# Patient Record
Sex: Female | Born: 1960 | Race: Black or African American | Hispanic: No | Marital: Single | Smoking: Current some day smoker
Health system: Southern US, Community
[De-identification: ages and names within clinical notes are randomized; demographics above are authoritative.]

## PROBLEM LIST (undated history)

## (undated) DIAGNOSIS — T7840XA Allergy, unspecified, initial encounter: Secondary | ICD-10-CM

## (undated) DIAGNOSIS — I10 Essential (primary) hypertension: Secondary | ICD-10-CM

## (undated) HISTORY — PX: ABDOMINAL HYSTERECTOMY: SHX81

---

## 2005-12-05 ENCOUNTER — Emergency Department (HOSPITAL_COMMUNITY): Admission: EM | Admit: 2005-12-05 | Discharge: 2005-12-05 | Payer: Self-pay | Admitting: Family Medicine

## 2006-06-01 ENCOUNTER — Emergency Department (HOSPITAL_COMMUNITY): Admission: EM | Admit: 2006-06-01 | Discharge: 2006-06-01 | Payer: Self-pay | Admitting: Family Medicine

## 2006-09-26 ENCOUNTER — Emergency Department (HOSPITAL_COMMUNITY): Admission: EM | Admit: 2006-09-26 | Discharge: 2006-09-26 | Payer: Self-pay | Admitting: Family Medicine

## 2007-09-10 ENCOUNTER — Emergency Department (HOSPITAL_COMMUNITY): Admission: EM | Admit: 2007-09-10 | Discharge: 2007-09-10 | Payer: Self-pay | Admitting: Emergency Medicine

## 2007-09-16 ENCOUNTER — Emergency Department (HOSPITAL_COMMUNITY): Admission: EM | Admit: 2007-09-16 | Discharge: 2007-09-16 | Payer: Self-pay | Admitting: Family Medicine

## 2007-12-02 ENCOUNTER — Emergency Department (HOSPITAL_COMMUNITY): Admission: EM | Admit: 2007-12-02 | Discharge: 2007-12-02 | Payer: Self-pay | Admitting: Family Medicine

## 2008-06-08 ENCOUNTER — Emergency Department (HOSPITAL_COMMUNITY): Admission: EM | Admit: 2008-06-08 | Discharge: 2008-06-08 | Payer: Self-pay | Admitting: Emergency Medicine

## 2008-06-24 ENCOUNTER — Emergency Department (HOSPITAL_COMMUNITY): Admission: EM | Admit: 2008-06-24 | Discharge: 2008-06-24 | Payer: Self-pay | Admitting: Emergency Medicine

## 2008-06-27 ENCOUNTER — Ambulatory Visit (HOSPITAL_COMMUNITY): Admission: RE | Admit: 2008-06-27 | Discharge: 2008-06-27 | Payer: Self-pay | Admitting: Orthopedic Surgery

## 2008-07-02 ENCOUNTER — Encounter: Admission: RE | Admit: 2008-07-02 | Discharge: 2008-07-13 | Payer: Self-pay | Admitting: Orthopedic Surgery

## 2009-11-04 ENCOUNTER — Emergency Department (HOSPITAL_COMMUNITY): Admission: EM | Admit: 2009-11-04 | Discharge: 2009-11-04 | Payer: Self-pay | Admitting: Family Medicine

## 2010-05-11 ENCOUNTER — Emergency Department (HOSPITAL_COMMUNITY): Admission: EM | Admit: 2010-05-11 | Discharge: 2010-05-11 | Payer: Self-pay | Admitting: Family Medicine

## 2010-11-27 LAB — POCT I-STAT, CHEM 8
Chloride: 106 mEq/L (ref 96–112)
HCT: 33 % — ABNORMAL LOW (ref 36.0–46.0)
Sodium: 139 mEq/L (ref 135–145)

## 2011-01-17 NOTE — Op Note (Signed)
NAME:  Laura Vega, SCHLEGEL            ACCOUNT NO.:  [id number]   MEDICAL RECORD NO.:  [id number]          PATIENT TYPE:  AMB   LOCATION:  SDS                          FACILITY:  [facility]   PHYSICIAN:  Artist Pais. Weingold, M.D.DATE OF BIRTH:  08-Aug-1961   DATE OF PROCEDURE:  06/27/2008  DATE OF DISCHARGE:                               OPERATIVE REPORT   PREOPERATIVE DIAGNOSIS:  Displaced left distal radius fracture, intra-  articular.   POSTOPERATIVE DIAGNOSIS:  Displaced left distal radius fracture, intra-  articular.   PROCEDURE:  Open reduction and internal fixation above.   SURGEON:  Artist Pais. Mina Marble, MD   ASSISTANT:  None.   ANESTHESIA:  General.   TOURNIQUET TIME:  46 minutes.   COMPLICATIONS:  None.   DRAINS:  None.   OPERATIVE REPORT:  The patient was taken to the operating suite and  after induction of adequate general anesthesia, the left upper extremity  was prepped and draped in usual sterile fashion.  Esmarch was used to  exsanguinate the limb and tourniquet was inflated to 250 mmHg.  At this  point in time, a incision was made over the flexor carpi radialis  tendon, 6-8 cm over the lower aspect of the left wrist.  Skin was  incised.  Sheath overlying the FCR was incised retracted to the midline.  The radial artery was tracked to the lateral side.  The interval between  these two was developed down to the level of pronator quadratus.  Pronator quadratus was subperiosteally stripped off the distal radius  revealing intra-articular volarly displaced distal radius fracture.  A  rongeur and curettes were used to debride the fracture site of clot.  Closed reduction was performed with longitudinal traction and extension  over a rolled up towel to reduce the volar displaced fracture.  At this  point in time, a standard DVR left plate was placed in the lower aspect  of the distal radius, fixed to the slotted hole on the plate, and  fluoroscopy was used to determine  adequate plate position.  Once this  was done, the remaining three cortical screws were placed proximally  followed by the smooth pegs distally.  Intraoperative fluoroscopy then  revealed near anatomic reduction both the  AP, lateral, and oblique view.  The wound was irrigated and loosely  closed in layers with 0 Vicryl to repair the pronator quadratus and a 3-  0 Prolene subcuticular stitch on the skin.  Steri-Strips, 4x4s, fluffs,  compressive dressing, and volar splint was applied.  The patient  tolerated the procedure well and went to recovery room in stable  fashion.      Artist Pais Mina Marble, M.D.  Electronically Signed     MAW/MEDQ  D:  06/27/2008  T:  06/27/2008  Job:  [id number]

## 2011-05-29 LAB — POCT URINALYSIS DIP (DEVICE)
Bilirubin Urine: NEGATIVE
Nitrite: NEGATIVE
Specific Gravity, Urine: >=1.03

## 2011-06-05 LAB — POCT URINALYSIS DIP (DEVICE)
Bilirubin Urine: NEGATIVE
Ketones, ur: NEGATIVE
Nitrite: NEGATIVE
Operator id: 235561
Urobilinogen, UA: 0.2

## 2011-06-05 LAB — CBC
HCT: 31 — ABNORMAL LOW
Hemoglobin: 10.5 — ABNORMAL LOW
MCHC: 33.9
MCV: 97.2
Platelets: 368
RBC: 3.19 — ABNORMAL LOW
RDW: 12.1
WBC: 4.7

## 2011-10-16 ENCOUNTER — Encounter (HOSPITAL_COMMUNITY): Payer: Self-pay

## 2011-10-16 ENCOUNTER — Emergency Department (INDEPENDENT_AMBULATORY_CARE_PROVIDER_SITE_OTHER)
Admission: EM | Admit: 2011-10-16 | Discharge: 2011-10-16 | Disposition: A | Discharge status: Home / Self Care | Payer: Self-pay | Source: Home / Self Care

## 2011-10-16 DIAGNOSIS — I10 Essential (primary) hypertension: Secondary | ICD-10-CM

## 2011-10-16 DIAGNOSIS — N390 Urinary tract infection, site not specified: Secondary | ICD-10-CM

## 2011-10-16 LAB — POCT URINALYSIS DIP (DEVICE)
Bilirubin Urine: NEGATIVE
Glucose, UA: NEGATIVE mg/dL
Nitrite: NEGATIVE
Urobilinogen, UA: 0.2 mg/dL (ref 0.0–1.0)
pH: 5 (ref 5.0–8.0)

## 2011-10-16 MED ORDER — CEPHALEXIN 500 MG PO CAPS: 500.0000 mg | ORAL_CAPSULE | Freq: Two times a day (BID) | ORAL | Status: AC

## 2011-10-16 NOTE — ED Provider Notes (Signed)
Laura Vega is a 51 y.o. female who presents to Urgent Care today for 1 day history of cystitis.  States that symptoms started this morning. Describes burning with urination. Also noted a little bit of blood in her urine. She denies any back pain, nausea, vomiting, abdominal pain. She has history of UTIs in the past, most recent was almost a year ago. Also endorses increased frequency. Some hesitancy.  Also of note, she her blood pressure was elevated at today's visit. She states she has a history of hypertension. She has been provided with blood pressure pills in the past. She states [facility] about her hypertension. She denies any headaches, chest pain, shortness of breath. No lower extremity edema. No history of CAD or heart problems.   PMH reviewed.  ROS as above otherwise neg Medications reviewed. No current facility-administered medications for this encounter.   No current outpatient prescriptions on file.    Exam:  BP 164/116  Pulse 82  Temp(Src) 98.8 F (37.1 C) (Oral)  Resp 18  SpO2 100% Gen: Well NAD HEENT: EOMI,  MMM Lungs: CTABL Nl WOB Heart: RRR no MRG Abd: NABS, NT, ND Exts: Non edematous BL  LE, warm and well perfused.   Assessment and Plan: #1. Cystitis: Plan to treat with Keflex 500 mg by mouth twice a day x7 days.  #2. Hypertension: No red flags on today's visit. She has a followup appointment already [facility] this [facility]. Encouraged her to keep this appointment.    Renold Don, MD 10/16/11 1900

## 2011-10-16 NOTE — ED Notes (Signed)
C/o pain w voiding since this AM; hysterectomy in past

## 2011-10-20 NOTE — ED Provider Notes (Signed)
Medical screening examination/treatment/procedure(s) were performed by non-physician practitioner and as supervising physician I was immediately available for consultation/collaboration.   Barkley Bruns MD.    Barkley Bruns, MD 10/20/11 1330

## 2012-11-27 ENCOUNTER — Encounter (HOSPITAL_COMMUNITY): Payer: Self-pay | Admitting: *Deleted

## 2012-11-27 ENCOUNTER — Emergency Department (INDEPENDENT_AMBULATORY_CARE_PROVIDER_SITE_OTHER)
Admission: EM | Admit: 2012-11-27 | Discharge: 2012-11-27 | Disposition: A | Discharge status: Home / Self Care | Payer: Self-pay | Source: Home / Self Care | Attending: Family Medicine | Admitting: Family Medicine

## 2012-11-27 DIAGNOSIS — J309 Allergic rhinitis, unspecified: Secondary | ICD-10-CM

## 2012-11-27 DIAGNOSIS — I10 Essential (primary) hypertension: Secondary | ICD-10-CM

## 2012-11-27 LAB — POCT I-STAT, CHEM 8
Chloride: 104 mEq/L (ref 96–112)
Creatinine, Ser: 0.7 mg/dL (ref 0.50–1.10)
HCT: 38 % (ref 36.0–46.0)
Hemoglobin: 12.9 g/dL (ref 12.0–15.0)
TCO2: 30 mmol/L (ref 0–100)

## 2012-11-27 MED ORDER — CETIRIZINE HCL 10 MG PO TABS: 10.0000 mg | ORAL_TABLET | Freq: Every day | ORAL | Status: DC

## 2012-11-27 MED ORDER — TRIAMTERENE-HCTZ 37.5-25 MG PO TABS: 1.0000 | ORAL_TABLET | Freq: Every day | ORAL | Status: DC

## 2012-11-27 MED ORDER — PREDNISONE 20 MG PO TABS: ORAL_TABLET | ORAL | Status: DC

## 2012-11-27 NOTE — ED Notes (Signed)
Pt  Has recurrent  Sinus  Problems   She  Reports  yest  Symptoms      Began  With  Sinus  Congestion  /  Pressure   She   Has  A  History  Of  htn  In  Past  She  States     She has  Not taken any  bp meds   In  Over  3  Months  -    She  Has  Also  neen taking    An allergy/ decongestant med  From  [facility]

## 2012-11-28 NOTE — ED Provider Notes (Signed)
History     CSN: [id number]  Arrival date & time 11/27/12  1155   First MD Initiated Contact with Patient 11/27/12 1245      Chief Complaint  Patient presents with  . Recurrent Sinusitis    (Consider location/radiation/quality/duration/timing/severity/associated sxs/prior treatment) HPI Comments: 52 year old female with history of chronic rhinitis and hypertension. Here complaining of increased clear nasal drainage, sinus pressure, sneezing and intermittent headaches. She's also found to have hypertension with blood pressure 152/103. Patient stated she is not taking any blood pressure medications for over a year. She used to go to the [facility] and has not been seen for over a year. Denies chest pain or shortness of breath. No leg swelling. No PND. Denies balance or gait or speech problems. No extremity weakness numbness or paresthesias.   History reviewed. No pertinent past medical history.  Past Surgical History  Procedure Laterality Date  . Abdominal hysterectomy      No family history on file.  History  Substance Use Topics  . Smoking status: Never Smoker   . Smokeless tobacco: Not on file  . Alcohol Use: No    OB History   Grav Para Term Preterm Abortions TAB SAB Ect Mult Living                  Review of Systems  Constitutional: Negative for fever, chills, diaphoresis and fatigue.  HENT: Positive for congestion, rhinorrhea, postnasal drip and sinus pressure. Negative for sore throat and trouble swallowing.   Eyes: Positive for itching. Negative for photophobia and visual disturbance.  Respiratory: Negative for cough and shortness of breath.   Cardiovascular: Negative for chest pain, palpitations and leg swelling.  Gastrointestinal: Negative for nausea, vomiting, abdominal pain and diarrhea.  Skin: Negative for rash.  Neurological: Positive for headaches. Negative for dizziness, tremors, seizures, syncope, speech difficulty, weakness and numbness.  All  other systems reviewed and are negative.    Allergies  Review of patient's allergies indicates no known allergies.  Home Medications   Current Outpatient Rx  Name  Route  Sig  Dispense  Refill  . cetirizine (ZYRTEC) 10 MG tablet   Oral   Take 1 tablet (10 mg total) by mouth daily.   30 tablet   0   . predniSONE (DELTASONE) 20 MG tablet      2 tabs po daily for 3 days   6 tablet   0   . triamterene-hydrochlorothiazide (MAXZIDE-25) 37.5-25 MG per tablet   Oral   Take 1 each (1 tablet total) by mouth daily.   30 tablet   1     BP 152/103  Pulse 66  Temp(Src) 97.9 F (36.6 C) (Oral)  Resp 18  SpO2 98%  Physical Exam  Nursing note and vitals reviewed. Constitutional: She is oriented to person, place, and time. She appears well-developed and well-nourished. No distress.  HENT:  Head: Normocephalic and atraumatic.  Nasal Congestion with erythema and swelling of nasal turbinates, clear rhinorrhea. pharyngeal erythema no exudates. No uvula deviation. No trismus. TM's normal  Eyes: Conjunctivae and EOM are normal. Pupils are equal, round, and reactive to light. Right eye exhibits no discharge. Left eye exhibits no discharge. No scleral icterus.  Neck: Neck supple. No JVD present.  Cardiovascular: Normal rate, regular rhythm and normal heart sounds.  Exam reveals no gallop and no friction rub.   No murmur heard. No LEE  Pulmonary/Chest: Effort normal and breath sounds normal. No respiratory distress. She has no wheezes. She  has no rales. She exhibits no tenderness.  Lymphadenopathy:    She has no cervical adenopathy.  Neurological: She is alert and oriented to person, place, and time.  Skin: No rash noted. She is not diaphoretic.    ED Course  Procedures (including critical care time)  Labs Reviewed  POCT I-STAT, CHEM 8 - Abnormal; Notable for the following:    Glucose, Bld 125 (*)    Calcium, Ion 1.26 (*)    All other components within normal limits   No  results found.   1. Allergic rhinitis   2. HTN (hypertension)       MDM  Treated with cetirizine, prednisone. Normal creatinine on point-of-care i-STAT. Prescribed Maxzide for blood pressure treatment. Asked patient to take half a tablet daily during the first 2 weeks and have her blood pressure rechecked at the [facility] or here. Supportive care and red flags that should prompt her return to medical attention discussed with patient and provided in writing.        Sharin Grave, MD 11/29/12 1243

## 2012-12-02 ENCOUNTER — Emergency Department (INDEPENDENT_AMBULATORY_CARE_PROVIDER_SITE_OTHER)
Admission: EM | Admit: 2012-12-02 | Discharge: 2012-12-02 | Disposition: A | Discharge status: Home / Self Care | Payer: Self-pay | Source: Home / Self Care

## 2012-12-02 ENCOUNTER — Encounter (HOSPITAL_COMMUNITY): Payer: Self-pay | Admitting: Emergency Medicine

## 2012-12-02 DIAGNOSIS — T50905A Adverse effect of unspecified drugs, medicaments and biological substances, initial encounter: Secondary | ICD-10-CM

## 2012-12-02 DIAGNOSIS — R42 Dizziness and giddiness: Secondary | ICD-10-CM

## 2012-12-02 DIAGNOSIS — I10 Essential (primary) hypertension: Secondary | ICD-10-CM

## 2012-12-02 DIAGNOSIS — T887XXA Unspecified adverse effect of drug or medicament, initial encounter: Secondary | ICD-10-CM

## 2012-12-02 HISTORY — DX: Essential (primary) hypertension: I10

## 2012-12-02 NOTE — ED Notes (Signed)
Reports dizziness started this am after taking medicine.  Denies dizziness at present.

## 2012-12-02 NOTE — ED Provider Notes (Signed)
History     CSN: [id number]  Arrival date & time 12/02/12  1314   None     No chief complaint on file.   (Consider location/radiation/quality/duration/timing/severity/associated sxs/prior treatment) HPI Comments: 52 year old female was here on 11/27/2012 for her chronic rhinitis and hypertension. Her blood pressure was 152/103. She did not been taking her medications for it she seen her PCP in over a year. At discharge is given a prescription for Dyazide to take one a day. Since taking the medicines she is twice had episodes of dizziness and daily nausea. The dizziness lasts for approximately 30 minutes on average. She states she does not eat breakfast or in the morning but she takes her medicine in the morning. This morning the episode of dizziness was a little more pronounced and had to go home from work. She did not have syncope, chest pain, shortness of breath or headache.   No past medical history on file.  Past Surgical History  Procedure Laterality Date  . Abdominal hysterectomy      No family history on file.  History  Substance Use Topics  . Smoking status: Never Smoker   . Smokeless tobacco: Not on file  . Alcohol Use: No    OB History   Grav Para Term Preterm Abortions TAB SAB Ect Mult Living                  Review of Systems  Constitutional: Negative for fever, activity change, appetite change and fatigue.  HENT: Negative.   Eyes: Negative.   Respiratory: Negative.   Gastrointestinal: Positive for nausea. Negative for abdominal pain.  Musculoskeletal: Negative.   Skin: Negative.   Neurological: Positive for dizziness and light-headedness. Negative for tremors, seizures, syncope, facial asymmetry, speech difficulty, weakness and numbness.  Psychiatric/Behavioral: Negative.     Allergies  Review of patient's allergies indicates no known allergies.  Home Medications   Current Outpatient Rx  Name  Route  Sig  Dispense  Refill  . cetirizine (ZYRTEC) 10  MG tablet   Oral   Take 1 tablet (10 mg total) by mouth daily.   30 tablet   0   . predniSONE (DELTASONE) 20 MG tablet      2 tabs po daily for 3 days   6 tablet   0   . triamterene-hydrochlorothiazide (MAXZIDE-25) 37.5-25 MG per tablet   Oral   Take 1 each (1 tablet total) by mouth daily.   30 tablet   1     BP 116/81  Pulse 105  Temp(Src) 97.8 F (36.6 C) (Oral)  Resp 16  SpO2 98%  Physical Exam  Nursing note and vitals reviewed. Constitutional: She is oriented to person, place, and time. She appears well-developed and well-nourished. No distress.  HENT:  Head: Normocephalic and atraumatic.  Right Ear: External ear normal.  Left Ear: External ear normal.  Eyes: EOM are normal. Pupils are equal, round, and reactive to light.  Neck: Normal range of motion. Neck supple.  Cardiovascular: Normal rate and normal heart sounds.   Pulmonary/Chest: Effort normal and breath sounds normal. No respiratory distress.  Abdominal: Soft. There is no tenderness.  Musculoskeletal: Normal range of motion. She exhibits no edema and no tenderness.  Neurological: She is alert and oriented to person, place, and time. No cranial nerve deficit.  Skin: Skin is warm and dry.  Psychiatric: She has a normal mood and affect.    ED Course  Procedures (including critical care time)  Labs Reviewed -  No data to display No results found.   1. Dizziness, nonspecific   2. HTN (hypertension)   3. Medication reaction, initial encounter       MDM  I suspect the dose of Dyazide in combination with taking the medicine in the morning on an stomach is causing her side effects of nausea and dizziness. She is asked to eat something prior to taking the medication and since she is at lunch take medicine at lunch. Her medicine is a tablet she is also advised to take only one half a tablet a day for now. Check your blood pressure at home every other day and record it in the diary. When you see your  physician early this coming month showed the blood pressure seen him as well as her current medication. He continued to have symptoms may return.         Hayden Rasmussen, NP 12/02/12 1601

## 2012-12-05 NOTE — ED Provider Notes (Signed)
Medical screening examination/treatment/procedure(s) were performed by resident physician or non-physician practitioner and as supervising physician I was immediately available for consultation/collaboration.   Alanson Hausmann DOUGLAS MD.   Gera Inboden D Breanna Mcdaniel, MD 12/05/12 1939 

## 2012-12-25 ENCOUNTER — Encounter (HOSPITAL_COMMUNITY): Payer: Self-pay

## 2012-12-25 ENCOUNTER — Emergency Department (INDEPENDENT_AMBULATORY_CARE_PROVIDER_SITE_OTHER)
Admission: EM | Admit: 2012-12-25 | Discharge: 2012-12-25 | Disposition: A | Discharge status: Home / Self Care | Payer: Self-pay | Source: Home / Self Care | Attending: Family Medicine | Admitting: Family Medicine

## 2012-12-25 DIAGNOSIS — J309 Allergic rhinitis, unspecified: Secondary | ICD-10-CM

## 2012-12-25 DIAGNOSIS — J302 Other seasonal allergic rhinitis: Secondary | ICD-10-CM

## 2012-12-25 MED ORDER — FLUTICASONE PROPIONATE 50 MCG/ACT NA SUSP: 2.0000 | Freq: Every day | NASAL | Status: DC

## 2012-12-25 NOTE — ED Notes (Signed)
C/o her allergies are bothering her, uses zyrtec from time to time

## 2012-12-25 NOTE — ED Provider Notes (Signed)
History     CSN: [id number]  Arrival date & time 12/25/12  1234   First MD Initiated Contact with Patient 12/25/12 [phone]      Chief Complaint  Patient presents with  . Allergies    (Consider location/radiation/quality/duration/timing/severity/associated sxs/prior treatment) HPI Comments: Pt reports problems with seasonal allergies. Has not been taking zyrtec regularly.   Patient is a 52 y.o. female presenting with URI. The history is provided by the patient.  URI Presenting symptoms: congestion, cough and rhinorrhea   Presenting symptoms: no fever   Severity:  Moderate Onset quality:  Gradual Duration:  3 weeks Timing:  Constant Progression:  Unchanged Chronicity:  New Relieved by:  Nothing Worsened by:  Nothing tried Ineffective treatments: occasional use of zyrtec. Associated symptoms: sneezing   Associated symptoms: no sinus pain   Associated symptoms comment:  Itchy watery eyes   Past Medical History  Diagnosis Date  . Hypertension     Past Surgical History  Procedure Laterality Date  . Abdominal hysterectomy      History reviewed. No pertinent family history.  History  Substance Use Topics  . Smoking status: Never Smoker   . Smokeless tobacco: Not on file  . Alcohol Use: No    OB History   Grav Para Term Preterm Abortions TAB SAB Ect Mult Living                  Review of Systems  Constitutional: Negative for fever and chills.  HENT: Positive for congestion, rhinorrhea, sneezing and postnasal drip.   Eyes: Positive for discharge and itching. Negative for photophobia, redness and visual disturbance.  Respiratory: Positive for cough.     Allergies  Review of patient's allergies indicates no known allergies.  Home Medications   Current Outpatient Rx  Name  Route  Sig  Dispense  Refill  . cetirizine (ZYRTEC) 10 MG tablet   Oral   Take 1 tablet (10 mg total) by mouth daily.   30 tablet   0   . fluticasone (FLONASE) 50 MCG/ACT nasal  spray   Nasal   Place 2 sprays into the nose daily.   16 g   2   . predniSONE (DELTASONE) 20 MG tablet      2 tabs po daily for 3 days   6 tablet   0   . triamterene-hydrochlorothiazide (MAXZIDE-25) 37.5-25 MG per tablet   Oral   Take 1 each (1 tablet total) by mouth daily.   30 tablet   1     BP 123/85  Pulse 78  Temp(Src) 97.9 F (36.6 C) (Oral)  Resp 16  SpO2 100%  Physical Exam  Constitutional: She appears well-developed and well-nourished. No distress.  HENT:  Right Ear: Tympanic membrane, external ear and ear canal normal.  Left Ear: Tympanic membrane, external ear and ear canal normal.  Nose: Rhinorrhea present. Right sinus exhibits no maxillary sinus tenderness and no frontal sinus tenderness. Left sinus exhibits no maxillary sinus tenderness and no frontal sinus tenderness.  Mouth/Throat: Oropharynx is clear and moist and mucous membranes are normal.  Eyes: Conjunctivae are normal. Right eye exhibits no discharge. Left eye exhibits no discharge.  Cardiovascular: Normal rate and regular rhythm.   Pulmonary/Chest: Effort normal and breath sounds normal.    ED Course  Procedures (including critical care time)  Labs Reviewed - No data to display No results found.   1. Seasonal allergies       MDM  Zyrtec daily, rx flonase.  Cathlyn Parsons, NP 12/25/12 1530

## 2012-12-25 NOTE — ED Notes (Signed)
Vitals taken by [employer]

## 2012-12-25 NOTE — Discharge Instructions (Signed)
Use the zyrtec and the nasal spray EVERY day while the pollen/spring season is bothering you.    Allergic Rhinitis Allergic rhinitis is when the mucous membranes in the nose respond to allergens. Allergens are particles in the air that cause your body to have an allergic reaction. This causes you to release allergic antibodies. Through a chain of events, these eventually cause you to release histamine into the blood stream (hence the use of antihistamines). Although meant to be protective to the body, it is this release that causes your discomfort, such as frequent sneezing, congestion and an itchy runny nose.  CAUSES  The pollen allergens may come from grasses, trees, and weeds. This is seasonal allergic rhinitis, or "hay fever." Other allergens cause year-round allergic rhinitis (perennial allergic rhinitis) such as house dust mite allergen, pet dander and mold spores.  SYMPTOMS   Nasal stuffiness (congestion).  Runny, itchy nose with sneezing and tearing of the eyes.  There is often an itching of the mouth, eyes and ears. It cannot be cured, but it can be controlled with medications. DIAGNOSIS  If you are unable to determine the offending allergen, skin or blood testing may find it. TREATMENT   Avoid the allergen.  Medications and allergy shots (immunotherapy) can help.  Hay fever may often be treated with antihistamines in pill or nasal spray forms. Antihistamines block the effects of histamine. There are over-the-counter medicines that may help with nasal congestion and swelling around the eyes. Check with your caregiver before taking or giving this medicine. If the treatment above does not work, there are many new medications your caregiver can prescribe. Stronger medications may be used if initial measures are ineffective. Desensitizing injections can be used if medications and avoidance fails. Desensitization is when a patient is given ongoing shots until the body becomes less  sensitive to the allergen. Make sure you follow up with your caregiver if problems continue. SEEK MEDICAL CARE IF:   You develop fever (more than 100.5 F (38.1 C).  You develop a cough that does not stop easily (persistent).  You have shortness of breath.  You start wheezing.  Symptoms interfere with normal daily activities. Document Released: 05/16/2001 Document Revised: 11/13/2011 Document Reviewed: 11/25/2008 [facility] Patient Information 2013 [place], [place].

## 2012-12-26 NOTE — ED Provider Notes (Signed)
Medical screening examination/treatment/procedure(s) were performed by non-physician practitioner and as supervising physician I was immediately available for consultation/collaboration.   [facility]; MD  Sharin Grave, MD 12/26/12 [phone]

## 2014-01-17 ENCOUNTER — Emergency Department (HOSPITAL_COMMUNITY)
Admission: EM | Admit: 2014-01-17 | Discharge: 2014-01-17 | Disposition: A | Discharge status: Home / Self Care | Payer: No Typology Code available for payment source | Attending: Emergency Medicine | Admitting: Emergency Medicine

## 2014-01-17 ENCOUNTER — Emergency Department (HOSPITAL_COMMUNITY): Payer: No Typology Code available for payment source

## 2014-01-17 ENCOUNTER — Encounter (HOSPITAL_COMMUNITY): Payer: Self-pay | Admitting: Emergency Medicine

## 2014-01-17 DIAGNOSIS — Z789 Other specified health status: Secondary | ICD-10-CM

## 2014-01-17 DIAGNOSIS — R34 Anuria and oliguria: Secondary | ICD-10-CM | POA: Insufficient documentation

## 2014-01-17 DIAGNOSIS — F1010 Alcohol abuse, uncomplicated: Secondary | ICD-10-CM | POA: Insufficient documentation

## 2014-01-17 DIAGNOSIS — Z79899 Other long term (current) drug therapy: Secondary | ICD-10-CM | POA: Insufficient documentation

## 2014-01-17 DIAGNOSIS — IMO0002 Reserved for concepts with insufficient information to code with codable children: Secondary | ICD-10-CM | POA: Insufficient documentation

## 2014-01-17 DIAGNOSIS — I10 Essential (primary) hypertension: Secondary | ICD-10-CM | POA: Insufficient documentation

## 2014-01-17 DIAGNOSIS — Z792 Long term (current) use of antibiotics: Secondary | ICD-10-CM | POA: Insufficient documentation

## 2014-01-17 DIAGNOSIS — K859 Acute pancreatitis without necrosis or infection, unspecified: Secondary | ICD-10-CM | POA: Insufficient documentation

## 2014-01-17 DIAGNOSIS — K7460 Unspecified cirrhosis of liver: Secondary | ICD-10-CM | POA: Insufficient documentation

## 2014-01-17 LAB — URINALYSIS, ROUTINE W REFLEX MICROSCOPIC
Bilirubin Urine: NEGATIVE
Glucose, UA: NEGATIVE mg/dL
KETONES UR: NEGATIVE mg/dL
NITRITE: NEGATIVE
PH: 6.5 (ref 5.0–8.0)
Protein, ur: 100 mg/dL — AB
Specific Gravity, Urine: 1.017 (ref 1.005–1.030)
UROBILINOGEN UA: 0.2 mg/dL (ref 0.0–1.0)

## 2014-01-17 LAB — HEPATIC FUNCTION PANEL
ALK PHOS: 57 U/L (ref 39–117)
ALT: 41 U/L — ABNORMAL HIGH (ref 0–35)
AST: 39 U/L — ABNORMAL HIGH (ref 0–37)
Albumin: 3.6 g/dL (ref 3.5–5.2)
Bilirubin, Direct: <0.2 mg/dL (ref 0.0–0.3)
TOTAL PROTEIN: 8.2 g/dL (ref 6.0–8.3)
Total Bilirubin: 0.5 mg/dL (ref 0.3–1.2)

## 2014-01-17 LAB — BASIC METABOLIC PANEL
BUN: 8 mg/dL (ref 6–23)
CALCIUM: 10 mg/dL (ref 8.4–10.5)
CHLORIDE: 93 meq/L — AB (ref 96–112)
CO2: 25 meq/L (ref 19–32)
Creatinine, Ser: 0.55 mg/dL (ref 0.50–1.10)
Glucose, Bld: 155 mg/dL — ABNORMAL HIGH (ref 70–99)
POTASSIUM: 3.7 meq/L (ref 3.7–5.3)
SODIUM: 137 meq/L (ref 137–147)

## 2014-01-17 LAB — CBC WITH DIFFERENTIAL/PLATELET
BASOS PCT: 0 % (ref 0–1)
Basophils Absolute: 0 10*3/uL (ref 0.0–0.1)
EOS PCT: 0 % (ref 0–5)
Eosinophils Absolute: 0 10*3/uL (ref 0.0–0.7)
HCT: 32.9 % — ABNORMAL LOW (ref 36.0–46.0)
HEMOGLOBIN: 11.3 g/dL — AB (ref 12.0–15.0)
LYMPHS ABS: 1.1 10*3/uL (ref 0.7–4.0)
Lymphocytes Relative: 9 % — ABNORMAL LOW (ref 12–46)
MCH: 31.7 pg (ref 26.0–34.0)
MCHC: 34.3 g/dL (ref 30.0–36.0)
MCV: 92.4 fL (ref 78.0–100.0)
MONOS PCT: 6 % (ref 3–12)
Monocytes Absolute: 0.7 10*3/uL (ref 0.1–1.0)
NEUTROS PCT: 85 % — AB (ref 43–77)
Neutro Abs: 9.8 10*3/uL — ABNORMAL HIGH (ref 1.7–7.7)
PLATELETS: 260 10*3/uL (ref 150–400)
RBC: 3.56 MIL/uL — ABNORMAL LOW (ref 3.87–5.11)
RDW: 12.6 % (ref 11.5–15.5)
WBC: 11.6 10*3/uL — AB (ref 4.0–10.5)

## 2014-01-17 LAB — URINE MICROSCOPIC-ADD ON

## 2014-01-17 LAB — LIPASE, BLOOD: Lipase: 175 U/L — ABNORMAL HIGH (ref 11–59)

## 2014-01-17 MED ORDER — OXYCODONE-ACETAMINOPHEN 5-325 MG PO TABS: 1.0000 | ORAL_TABLET | Freq: Four times a day (QID) | ORAL | Status: DC | PRN

## 2014-01-17 MED ORDER — PROMETHAZINE HCL 25 MG PO TABS: 25.0000 mg | ORAL_TABLET | Freq: Four times a day (QID) | ORAL | Status: DC | PRN

## 2014-01-17 MED ORDER — SODIUM CHLORIDE 0.9 % IV BOLUS (SEPSIS)
1000.0000 mL | Freq: Once | INTRAVENOUS | Status: AC
  Administered 2014-01-17: 1000 mL via INTRAVENOUS

## 2014-01-17 MED ORDER — MORPHINE SULFATE 4 MG/ML IJ SOLN
4.0000 mg | Freq: Once | INTRAMUSCULAR | Status: AC
  Administered 2014-01-17: 4 mg via INTRAVENOUS
  Filled 2014-01-17: qty 1

## 2014-01-17 MED ORDER — SODIUM CHLORIDE 0.9 % IV SOLN
INTRAVENOUS | Status: DC
  Administered 2014-01-17: 08:00:00 via INTRAVENOUS

## 2014-01-17 MED ORDER — KETOROLAC TROMETHAMINE 30 MG/ML IJ SOLN
30.0000 mg | Freq: Once | INTRAMUSCULAR | Status: AC
  Administered 2014-01-17: 30 mg via INTRAVENOUS
  Filled 2014-01-17: qty 1

## 2014-01-17 MED ORDER — ONDANSETRON HCL 4 MG/2ML IJ SOLN
4.0000 mg | Freq: Once | INTRAMUSCULAR | Status: AC
  Administered 2014-01-17: 4 mg via INTRAVENOUS
  Filled 2014-01-17: qty 2

## 2014-01-17 NOTE — ED Provider Notes (Signed)
CSN: [id number]     Arrival date & time 01/17/14  0745 History   First MD Initiated Contact with Patient 01/17/14 [phone]     Chief Complaint  Patient presents with  . Flank Pain  . Abdominal Pain     (Consider location/radiation/quality/duration/timing/severity/associated sxs/prior Treatment) HPI  Patient presents to the emergency department with no significant past medical history with complaints of left-sided flank pain that started yesterday. She has had decreasing urination (nurse reports no change to urination, I asked pt twice and she says less urination) but normal bowel movements. She states that the pain is in her left side back and radiates down towards her groin. She feels like there is pressure inside. The pain shoots down and she is writhing in there exam bed. She reports the pain is severe and she has had 1 episode of vomiting which happened last night. She has not had any diarrhea or fever, no vaginal discharge. No hx of abdominal surgeries, kidney stones or diverticulitis.   Past Medical History  Diagnosis Date  . Hypertension    Past Surgical History  Procedure Laterality Date  . Abdominal hysterectomy     No family history on file. History  Substance Use Topics  . Smoking status: Never Smoker   . Smokeless tobacco: Not on file  . Alcohol Use: Yes     Comment: occasionally    OB History   Grav Para Term Preterm Abortions TAB SAB Ect Mult Living                 Review of Systems   Review of Systems  Gen: no weight loss, fevers, chills, night sweats  Eyes: no discharge or drainage, no occular pain or visual changes  Nose: no epistaxis or rhinorrhea  Mouth: no dental pain, no sore throat  Neck: no neck pain  Lungs:No wheezing, coughing or hemoptysis CV: no chest pain, palpitations, dependent edema or orthopnea  Abd: + flank pain nausea, vomiting,  NO diarrhea GU: no dysuria or gross hematuria  MSK:  No muscle weakness or pain Neuro: no headache, no focal  neurologic deficits  Skin: no rash or wounds Psyche: no complaints    Allergies  Review of patient's allergies indicates no known allergies.  Home Medications   Prior to Admission medications   Medication Sig Start Date End Date Taking? Authorizing Provider  cetirizine (ZYRTEC) 10 MG tablet Take 1 tablet (10 mg total) by mouth daily. 11/27/12   Adlih Moreno-Coll, MD  fluticasone (FLONASE) 50 MCG/ACT nasal spray Place 2 sprays into the nose daily. 12/25/12   Cathlyn Parsons, NP  predniSONE (DELTASONE) 20 MG tablet 2 tabs po daily for 3 days 11/27/12   Sharin Grave, MD  triamterene-hydrochlorothiazide (MAXZIDE-25) 37.5-25 MG per tablet Take 1 each (1 tablet total) by mouth daily. 11/27/12   Adlih Moreno-Coll, MD   BP 95/53  Pulse 70  Temp(Src) 98.5 F (36.9 C) (Oral)  Resp 20  Ht 5\' 6"  (1.676 m)  Wt 147 lb (66.679 kg)  BMI 23.74 kg/m2  SpO2 98% Physical Exam  Nursing note and vitals reviewed. Constitutional: She appears well-developed and well-nourished. No distress.  HENT:  Head: Normocephalic and atraumatic.  Eyes: Pupils are equal, round, and reactive to light.  Neck: Normal range of motion. Neck supple.  Cardiovascular: Normal rate and regular rhythm.   Pulmonary/Chest: Effort normal.  Abdominal: Soft. Bowel sounds are normal. She exhibits no distension. There is tenderness in the left upper quadrant and left lower quadrant.  There is CVA tenderness. There is no rigidity and no guarding. No hernia.  Neurological: She is alert.  Skin: Skin is warm and dry.    ED Course  Procedures (including critical care time) Labs Review Labs Reviewed  URINALYSIS, ROUTINE W REFLEX MICROSCOPIC - Abnormal; Notable for the following:    Hgb urine dipstick MODERATE (*)    Protein, ur 100 (*)    Leukocytes, UA TRACE (*)    All other components within normal limits  CBC WITH DIFFERENTIAL - Abnormal; Notable for the following:    WBC 11.6 (*)    RBC 3.56 (*)    Hemoglobin 11.3 (*)     HCT 32.9 (*)    Neutrophils Relative % 85 (*)    Neutro Abs 9.8 (*)    Lymphocytes Relative 9 (*)    All other components within normal limits  BASIC METABOLIC PANEL - Abnormal; Notable for the following:    Chloride 93 (*)    Glucose, Bld 155 (*)    All other components within normal limits  URINE MICROSCOPIC-ADD ON - Abnormal; Notable for the following:    Squamous Epithelial / LPF FEW (*)    All other components within normal limits  HEPATIC FUNCTION PANEL - Abnormal; Notable for the following:    AST 39 (*)    ALT 41 (*)    All other components within normal limits  LIPASE, BLOOD - Abnormal; Notable for the following:    Lipase 175 (*)    All other components within normal limits    Imaging Review Ct Abdomen Pelvis Wo Contrast  01/17/2014   CLINICAL DATA:  Left flank pain  EXAM: CT ABDOMEN AND PELVIS WITHOUT CONTRAST  TECHNIQUE: Multidetector CT imaging of the abdomen and pelvis was performed following the standard protocol without IV contrast.  COMPARISON:  None.  FINDINGS: There are extensive inflammatory changes in an about the pancreas. Fluid density and stranding extends from the tail inferiorly into the retroperitoneum and into the left perinephric space. No obvious pancreatic hemorrhage. Contrast was not administered.  Diffuse hepatic steatosis. There is an area of relatively higher density in the left lobe worrisome for a small mass lesion measuring 3.1 x 3.6 cm. The liver is markedly enlarged. The left lobe is prominent without nodularity. Early cirrhotic change cannot be excluded.  Gallbladder is unremarkable.  Right kidney and adrenal glands are within normal limits. Spleen is within normal limits.  Normal appendix.  Bladder is unremarkable.  3.5 cm simple cyst in the left adnexa.  IMPRESSION: Findings compatible with acute pancreatitis. Inflammatory changes to extend to the left perinephric space and left side of the retroperitoneum.  Abnormal appearance of the liver as  described. There is enlargement and diffuse hepatic steatosis. Early features of cirrhosis may be present and there is a 3.5 cm mass in the left lobe. They should be worked up with an MRI of the liver with an without contrast.   Electronically Signed   By: Maryclare BeanArt  Hoss M.D.   On: 01/17/2014 10:22     EKG Interpretation None      MDM   Final diagnoses:  Acute pancreatitis  Cirrhosis    Patient most likely with Kidney stone. Other possible differentials includes musculoskeletal injury, pyelonephritis, diverticulitis.  Cbc, bmp, urinalysis ordered. IV saline, morphine and Zofran given for pain control.  Scan is suggestive of acute pancreatitis and early cirrhosis. The patient now admits that she has been drinking daily since march to celebrate graduation and b-day  parties. She is no longer having pain and looks well. [facility], they will be able to follow-up on her visit today.  She feels well enough and prefers to go home, she has decided that she will stop drinking. Discussed clear liquid diet for a few days.   Rx: Percocet and phenergan  52 y.o.Laura Vega's evaluation in the Emergency Department is complete. It has been determined that no acute conditions requiring further emergency intervention are present at this time. The patient/guardian have been advised of the diagnosis and plan. We have discussed signs and symptoms that warrant return to the ED, such as changes or worsening in symptoms.  Vital signs are stable at discharge. Filed Vitals:   01/17/14 1045  BP: 95/53  Pulse: 70  Temp:   Resp:     Patient/guardian has voiced understanding and agreed to follow-up with the PCP or specialist.    Dorthula Matasiffany G Phinley Schall, PA-C 01/17/14 1127

## 2014-01-17 NOTE — ED Notes (Signed)
Pt c/o left sided flank pain that started yesterday. Stated that the left side of her back in swollen now. Self administered OTC laxative medication that did not help with pain either. Denies any trouble urinating. Stated last BM was Tuesday. Stated that she vomited last night also.

## 2014-01-17 NOTE — Discharge Instructions (Signed)
Diet The clear liquid diet consists of foods that are liquid or will become liquid at room temperature. Examples of foods allowed on a clear liquid diet include fruit juice, broth or bouillon, gelatin, or frozen ice pops. You should be able to see through the liquid. The purpose of this diet is to provide the necessary fluids, electrolytes (such as sodium and potassium), and energy to keep the body functioning during times when you are not able to consume a regular diet. A clear liquid diet should not be continued for long periods of time, as it is not nutritionally adequate.  A CLEAR LIQUID DIET MAY BE NEEDED:  When a sudden-onset (acute) condition occurs before or after surgery.   As the first step in oral feeding.   For fluid and electrolyte replacement in diarrheal diseases.   As a diet before certain medical tests are performed.  ADEQUACY The clear liquid diet is adequate only in ascorbic acid, according to the Recommended Dietary Allowances of the [employer].  CHOOSING FOODS Breads and Starches  Allowed: None are allowed.   Avoid: All are to be avoided.  Vegetables  Allowed: Strained vegetable juices.   Avoid: Any others.  Fruit  Allowed: Strained fruit juices and fruit drinks. Include 1 serving of citrus or vitamin C-enriched fruit juice daily.   Avoid: Any others.  Meat and Meat Substitutes  Allowed: None are allowed.   Avoid: All are to be avoided.  Milk Products  Allowed: None are allowed.   Avoid: All are to be avoided.  Soups and Combination Foods  Allowed: Clear bouillon, broth, or strained broth-based soups.   Avoid: Any others.  Desserts and Sweets  Allowed: Sugar, honey. High-protein gelatin. Flavored gelatin, ices, or frozen ice pops that do not contain milk.   Avoid: Any others.  Fats and Oils  Allowed: None are allowed.   Avoid: All are to be avoided.  Beverages  Allowed: Cereal  beverages, coffee (regular or decaffeinated), tea, or soda at the discretion of your health care provider.   Avoid: Any others.  Condiments  Allowed: Salt.   Avoid: Any others, including pepper.  Supplements  Allowed: Liquid nutrition beverages that you can see through.   Avoid: Any others that contain lactose or fiber. SAMPLE MEAL PLAN Breakfast  4 oz (120 mL) strained orange juice.   to 1 cup (120 to 240 mL) gelatin (plain or fortified).  1 cup (240 mL) beverage (coffee or tea).  Sugar, if desired. Midmorning Snack   cup (120 mL) gelatin (plain or fortified). Lunch  1 cup (240 mL) broth or consomm.  4 oz (120 mL) strained grapefruit juice.   cup (120 mL) gelatin (plain or fortified).  1 cup (240 mL) beverage (coffee or tea).  Sugar, if desired. Midafternoon Snack   cup (120 mL) fruit ice.   cup (120 mL) strained fruit juice. Dinner  1 cup (240 mL) broth or consomm.   cup (120 mL) cranberry juice.   cup (120 mL) flavored gelatin (plain or fortified).  1 cup (240 mL) beverage (coffee or tea).  Sugar, if desired. Evening Snack  4 oz (120 mL) strained apple juice (vitamin C-fortified).   cup (120 mL) flavored gelatin (plain or fortified). MAKE SURE YOU:  Understand these instructions.  Will watch your child's condition.  Will get help right away if your child is not doing well or gets worse. Document Released: 08/21/2005 Document Revised: 04/23/2013 Document Reviewed: 01/21/2013 [facility] Patient Information 2014 [place], [place].  Acute Pancreatitis Acute pancreatitis is a disease in which the pancreas becomes suddenly inflamed. The pancreas is a large gland located behind your stomach. The pancreas produces enzymes that help digest food. The pancreas also releases the hormones glucagon and insulin that help regulate blood sugar. Damage to the pancreas occurs when the digestive enzymes from the pancreas are activated and begin  attacking the pancreas before being released into the intestine. Most acute attacks last a couple of days and can cause serious complications. Some people become dehydrated and develop low blood pressure. In severe cases, bleeding into the pancreas can lead to shock and can be life-threatening. The lungs, heart, and kidneys may fail. CAUSES  Pancreatitis can happen to anyone. In some cases, the cause is unknown. Most cases are caused by:  Alcohol abuse.  Gallstones. Other less common causes are:  Certain medicines.  Exposure to certain chemicals.  Infection.  Damage caused by an accident (trauma).  Abdominal surgery. SYMPTOMS   Pain in the upper abdomen that may radiate to the back.  Tenderness and swelling of the abdomen.  Nausea and vomiting. DIAGNOSIS  Your caregiver will perform a physical exam. Blood and stool tests may be done to confirm the diagnosis. Imaging tests may also be done, such as X-rays, CT scans, or an ultrasound of the abdomen. TREATMENT  Treatment usually requires a stay in the hospital. Treatment may include:  Pain medicine.  Fluid replacement through an intravenous line (IV).  Placing a tube in the stomach to remove stomach contents and control vomiting.  Not eating for 3 or 4 days. This gives your pancreas a rest, because enzymes are not being produced that can cause further damage.  Antibiotic medicines if your condition is caused by an infection.  Surgery of the pancreas or gallbladder. HOME CARE INSTRUCTIONS   Follow the diet advised by your caregiver. This may involve avoiding alcohol and decreasing the amount of fat in your diet.  Eat smaller, more frequent meals. This reduces the amount of digestive juices the pancreas produces.  Drink enough fluids to keep your urine clear or pale yellow.  Only take over-the-counter or prescription medicines as directed by your caregiver.  Avoid drinking alcohol if it caused your condition.  Do not  smoke.  Get plenty of rest.  Check your blood sugar at home as directed by your caregiver.  Keep all follow-up appointments as directed by your caregiver. SEEK MEDICAL CARE IF:   You do not recover as quickly as expected.  You develop new or worsening symptoms.  You have persistent pain, weakness, or nausea.  You recover and then have another episode of pain. SEEK IMMEDIATE MEDICAL CARE IF:   You are unable to eat or keep fluids down.  Your pain becomes severe.  You have a fever or persistent symptoms for more than 2 to 3 days.  You have a fever and your symptoms suddenly get worse.  Your skin or the white part of your eyes turn yellow (jaundice).  You develop vomiting.  You feel dizzy, or you faint.  Your blood sugar is high (over 300 mg/dL). MAKE SURE YOU:   Understand these instructions.  Will watch your condition.  Will get help right away if you are not doing well or get worse. Document Released: 08/21/2005 Document Revised: 02/20/2012 Document Reviewed: 11/30/2011 [facility] Patient Information 2014 [place], [place].  Cirrhosis Cirrhosis is a condition of scarring of the liver which is caused when the liver has tried repairing itself following damage.  This damage may come from a previous infection such as one of the forms of hepatitis (usually hepatitis C), or the damage may come from being injured by toxins. The main toxin that causes this damage is alcohol. The scarring of the liver from use of alcohol is irreversible. That means the liver cannot return to normal even though alcohol is not used any more. The main danger of hepatitis C infection is that it may cause long-lasting (chronic) liver disease, and this also may lead to cirrhosis. This complication is progressive and irreversible. CAUSES  Prior to available blood tests, hepatitis C could be contracted by blood transfusions. Since testing of blood has improved, this is now unlikely. This infection can also be  contracted through intravenous drug use and the sharing of needles. It can also be contracted through sexual relationships. The injury caused by alcohol comes from too much use. It is not a few drinks that poison the liver, but years of misuse. Usually there will be some signs and symptoms early with scarring of the liver that suggest the development of better habits. Alcohol should never be used while using acetaminophen. A small dose of both taken together may cause irreversible damage to the liver. HOME CARE INSTRUCTIONS  There is no specific treatment for cirrhosis. However, there are things you can do to avoid making the condition worse.  Rest as needed.  Eat a well-balanced diet. Your caregiver can help you with suggestions.  Vitamin supplements including vitamins A, K, D, and thiamine can help.  A low-salt diet, water restriction, or diuretic medicine may be needed to reduce fluid retention.  Avoid alcohol. This can be extremely toxic if combined with acetaminophen.  Avoid drugs which are toxic to the liver. Some of these include isoniazid, methyldopa, acetaminophen, anabolic steroids (muscle-building drugs), erythromycin, and oral contraceptives (birth control pills). Check with your caregiver to make sure medicines you are presently taking will not be harmful.  Periodic blood tests may be required. Follow your caregiver's advice regarding the timing of these.  Milk thistle is an herbal remedy which does protect the liver against toxins. However, it will not help once the liver has been scarred. SEEK MEDICAL CARE IF:  You have increasing fatigue or weakness.  You develop swelling of the hands, feet, legs, or face.  You vomit bright red blood, or a coffee ground appearing material.  You have blood in your stools, or the stools turn black and tarry.  You have a fever.  You develop loss of appetite, or have nausea and vomiting.  You develop jaundice.  You develop easy bruising  or bleeding.  You have worsening of any of the problems you are concerned about. Document Released: 08/21/2005 Document Revised: 11/13/2011 Document Reviewed: 04/08/2008 [facility] Patient Information 2014 [place], [place].

## 2014-01-18 NOTE — ED Provider Notes (Signed)
Medical screening examination/treatment/procedure(s) were performed by non-physician practitioner and as supervising physician I was immediately available for consultation/collaboration.   EKG Interpretation None        Xitlalic Maslin T Man Effertz, MD 01/18/14 0803 

## 2015-12-01 ENCOUNTER — Emergency Department (HOSPITAL_COMMUNITY): Payer: BLUE CROSS/BLUE SHIELD

## 2015-12-01 ENCOUNTER — Encounter (HOSPITAL_COMMUNITY): Payer: Self-pay | Admitting: *Deleted

## 2015-12-01 ENCOUNTER — Emergency Department (INDEPENDENT_AMBULATORY_CARE_PROVIDER_SITE_OTHER)
Admission: EM | Admit: 2015-12-01 | Discharge: 2015-12-01 | Disposition: A | Discharge status: Home / Self Care | Payer: BLUE CROSS/BLUE SHIELD | Source: Home / Self Care | Attending: Family Medicine | Admitting: Family Medicine

## 2015-12-01 ENCOUNTER — Emergency Department (INDEPENDENT_AMBULATORY_CARE_PROVIDER_SITE_OTHER): Payer: BLUE CROSS/BLUE SHIELD

## 2015-12-01 DIAGNOSIS — J069 Acute upper respiratory infection, unspecified: Secondary | ICD-10-CM

## 2015-12-01 MED ORDER — AZITHROMYCIN 250 MG PO TABS: ORAL_TABLET | ORAL | Status: DC

## 2015-12-01 MED ORDER — HYDROCOD POLST-CPM POLST ER 10-8 MG/5ML PO SUER: 5.0000 mL | Freq: Two times a day (BID) | ORAL | Status: DC | PRN

## 2015-12-01 NOTE — Discharge Instructions (Signed)
Drink plenty of fluids as discussed, use medicine as prescribed, and mucinex or delsym for cough. Return or see your doctor if further problems °

## 2015-12-01 NOTE — ED Provider Notes (Signed)
CSN: [id number]     Arrival date & time 12/01/15  1611 History   First MD Initiated Contact with Patient 12/01/15 1752     Chief Complaint  Patient presents with  . Laura   (Consider location/radiation/quality/duration/timing/severity/associated sxs/prior Treatment) Patient is a 55 y.o. female presenting with Laura. The history is provided by the patient.  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 Relieved by:  None tried Worsened by:  Nothing tried Ineffective treatments:  None tried Associated symptoms: no chills, no fever, no rhinorrhea and no wheezing     Past Medical History  Diagnosis Date  . Hypertension    Past Surgical History  Procedure Laterality Date  . Abdominal hysterectomy     History reviewed. No pertinent family history. Social History  Substance Use Topics  . Smoking status: Never Smoker   . Smokeless tobacco: None  . Alcohol Use: Yes     Comment: occasionally    OB History    No data available     Review of Systems  Constitutional: Negative.  Negative for fever and chills.  HENT: Negative.  Negative for rhinorrhea.   Respiratory: Positive for Laura. Negative for wheezing.   Cardiovascular: Negative.     Allergies  Review of patient's allergies indicates no known allergies.  Home Medications   Prior to Admission medications   Medication Sig Start Date End Date Taking? Authorizing Provider  azithromycin (ZITHROMAX Z-PAK) 250 MG tablet Take as directed on pack 12/01/15   Linna HoffJames D Dreden Rivere, MD  cetirizine (ZYRTEC) 10 MG tablet Take 10 mg by mouth daily as needed for allergies.    Historical Provider, MD  chlorpheniramine-HYDROcodone (TUSSIONEX PENNKINETIC ER) 10-8 MG/5ML  SUER Take 5 mLs by mouth every 12 (twelve) hours as needed for Laura. 12/01/15   Linna HoffJames D Lourie Retz, MD  oxyCODONE-acetaminophen (PERCOCET/ROXICET) 5-325 MG per tablet Take 1-2 tablets by mouth every 6 (six) hours as needed. 01/17/14   Tiffany Neva SeatGreene, PA-C  promethazine (PHENERGAN) 25 MG tablet Take 1 tablet (25 mg total) by mouth every 6 (six) hours as needed for nausea or vomiting. 01/17/14   Marlon Peliffany Greene, PA-C  triamterene-hydrochlorothiazide (MAXZIDE-25) 37.5-25 MG per tablet Take 1 each (1 tablet total) by mouth daily. 11/27/12   Sharin GraveAdlih Moreno-Coll, MD   Meds Ordered and Administered this Visit  Medications - No data to display  BP 119/83 mmHg  Pulse 80  Temp(Src) 99.1 F (37.3 C) (Oral)  Resp 18  SpO2 100% No data found.   Physical Exam  Constitutional: She is oriented to person, place, and time. She appears well-developed and well-nourished.  HENT:  Right Ear: External ear normal.  Left Ear: External ear normal.  Mouth/Throat: Oropharynx is clear and moist.  Neck: Normal range of motion. Neck supple.  Cardiovascular: Normal rate, normal heart sounds and intact distal pulses.   Pulmonary/Chest: Effort normal and breath sounds normal.  Abdominal: Soft. Bowel sounds are normal.  Lymphadenopathy:    She has no cervical adenopathy.  Neurological: She is alert and oriented to person, place, and time.  Skin: Skin is warm and dry.  Nursing note and vitals reviewed.   ED Course  Procedures (including critical care time)  Labs Review Labs Reviewed - No data to display  Imaging Review No results found.   Visual Acuity Review  Right Eye Distance:   Left Eye Distance:   Bilateral Distance:    Right Eye Near:   Left Eye Near:    Bilateral Near:         MDM   1. Acute URI    Meds ordered this encounter  Medications  . azithromycin (ZITHROMAX Z-PAK) 250 MG tablet    Sig: Take as directed on pack    Dispense:  6 tablet    Refill:  0  . chlorpheniramine-HYDROcodone  (TUSSIONEX PENNKINETIC ER) 10-8 MG/5ML SUER    Sig: Take 5 mLs by mouth every 12 (twelve) hours as needed for Laura.    Dispense:  140 mL    Refill:  0        Linna Hoff, MD 12/01/15 [phone]

## 2015-12-01 NOTE — ED Notes (Signed)
PT  REPORTS  SYMPTOMS  OF  COUGH AND    SORETHROAT WHICH   SHE  REPORTS  SHE  HAS  HAD  THE  SYMPTOMS  FOR       SEV  WEEKS  WORSE OVER LAST  SEV  DAYS    PT    REPORTS     SYMPTOMS  NOT  RELEIVED  BY OTC MEDS      PT  REPORTS     COUGH  IS  CONSISTENT  AND NON  PRODUCTIVE

## 2017-03-04 ENCOUNTER — Emergency Department (HOSPITAL_COMMUNITY)
Admission: EM | Admit: 2017-03-04 | Discharge: 2017-03-04 | Disposition: A | Discharge status: Home / Self Care | Payer: BLUE CROSS/BLUE SHIELD | Attending: Emergency Medicine | Admitting: Emergency Medicine

## 2017-03-04 ENCOUNTER — Encounter (HOSPITAL_COMMUNITY): Payer: Self-pay | Admitting: *Deleted

## 2017-03-04 DIAGNOSIS — I10 Essential (primary) hypertension: Secondary | ICD-10-CM | POA: Insufficient documentation

## 2017-03-04 DIAGNOSIS — J36 Peritonsillar abscess: Secondary | ICD-10-CM

## 2017-03-04 DIAGNOSIS — Z79899 Other long term (current) drug therapy: Secondary | ICD-10-CM | POA: Insufficient documentation

## 2017-03-04 DIAGNOSIS — J029 Acute pharyngitis, unspecified: Secondary | ICD-10-CM | POA: Diagnosis present

## 2017-03-04 LAB — CBC WITH DIFFERENTIAL/PLATELET
Basophils Absolute: 0 10*3/uL (ref 0.0–0.1)
Basophils Relative: 0 %
EOS ABS: 0.1 10*3/uL (ref 0.0–0.7)
EOS PCT: 1 %
HCT: 35.9 % — ABNORMAL LOW (ref 36.0–46.0)
HEMOGLOBIN: 11.7 g/dL — AB (ref 12.0–15.0)
LYMPHS ABS: 2.1 10*3/uL (ref 0.7–4.0)
LYMPHS PCT: 19 %
MCH: 31.3 pg (ref 26.0–34.0)
MCHC: 32.6 g/dL (ref 30.0–36.0)
MCV: 96 fL (ref 78.0–100.0)
Monocytes Absolute: 0.5 10*3/uL (ref 0.1–1.0)
Monocytes Relative: 4 %
NEUTROS PCT: 76 %
Neutro Abs: 8.5 10*3/uL — ABNORMAL HIGH (ref 1.7–7.7)
PLATELETS: 240 10*3/uL (ref 150–400)
RBC: 3.74 MIL/uL — AB (ref 3.87–5.11)
RDW: 13.1 % (ref 11.5–15.5)
WBC: 11.1 10*3/uL — ABNORMAL HIGH (ref 4.0–10.5)

## 2017-03-04 LAB — BASIC METABOLIC PANEL
Anion gap: 9 (ref 5–15)
BUN: 10 mg/dL (ref 6–20)
CHLORIDE: 102 mmol/L (ref 101–111)
CO2: 26 mmol/L (ref 22–32)
CREATININE: 0.8 mg/dL (ref 0.44–1.00)
Calcium: 9.6 mg/dL (ref 8.9–10.3)
Glucose, Bld: 130 mg/dL — ABNORMAL HIGH (ref 65–99)
POTASSIUM: 2.9 mmol/L — AB (ref 3.5–5.1)
SODIUM: 137 mmol/L (ref 135–145)

## 2017-03-04 LAB — RAPID STREP SCREEN (MED CTR MEBANE ONLY): STREPTOCOCCUS, GROUP A SCREEN (DIRECT): NEGATIVE

## 2017-03-04 MED ORDER — MORPHINE SULFATE (PF) 4 MG/ML IV SOLN
4.0000 mg | Freq: Once | INTRAVENOUS | Status: AC
  Administered 2017-03-04: 4 mg via INTRAVENOUS
  Filled 2017-03-04: qty 1

## 2017-03-04 MED ORDER — BUTAMBEN-TETRACAINE-BENZOCAINE 2-2-14 % EX AERO
1.0000 | INHALATION_SPRAY | Freq: Once | CUTANEOUS | Status: AC
  Administered 2017-03-04: 1 via TOPICAL
  Filled 2017-03-04: qty 20

## 2017-03-04 MED ORDER — DEXAMETHASONE SODIUM PHOSPHATE 10 MG/ML IJ SOLN
10.0000 mg | Freq: Once | INTRAMUSCULAR | Status: AC
  Administered 2017-03-04: 10 mg via INTRAVENOUS
  Filled 2017-03-04: qty 1

## 2017-03-04 MED ORDER — HYDROCODONE-ACETAMINOPHEN 7.5-325 MG/15ML PO SOLN: 10.0000 mL | Freq: Four times a day (QID) | ORAL | 0 refills | Status: DC | PRN

## 2017-03-04 MED ORDER — CLINDAMYCIN PHOSPHATE 300 MG/50ML IV SOLN: 300.0000 mg | Freq: Once | INTRAVENOUS | Status: DC

## 2017-03-04 MED ORDER — CLINDAMYCIN PHOSPHATE 600 MG/50ML IV SOLN
600.0000 mg | Freq: Once | INTRAVENOUS | Status: AC
  Administered 2017-03-04: 600 mg via INTRAVENOUS
  Filled 2017-03-04: qty 50

## 2017-03-04 MED ORDER — CLINDAMYCIN HCL 150 MG PO CAPS: 450.0000 mg | ORAL_CAPSULE | Freq: Three times a day (TID) | ORAL | 0 refills | Status: DC

## 2017-03-04 NOTE — Discharge Instructions (Signed)
Take antibiotic for the next week. Take with food Use pain medicine as needed Follow up with your doctor Return for worsening symptoms

## 2017-03-04 NOTE — ED Triage Notes (Signed)
Pt reports having sore throat x 2 days with a fever. Pt has swelling to her throat, now unable to swallow anything. Airway intact at this time.

## 2017-03-04 NOTE — ED Notes (Addendum)
Pt ambulated to room from waiting room, tolerated well. 

## 2017-03-04 NOTE — ED Provider Notes (Signed)
[facility] Provider Note   CSN: [id number] Arrival date & time: 03/04/17  0856     History   Chief Complaint Chief Complaint  Patient presents with  . Sore Throat  . Fever    HPI Laura Vega is a 56 y.o. female who presents with a sore throat. She states that Friday (2 days ago) she developed a sore throat but the pain was mild. Yesterday she developed a fever and worsening pain. Today she's had difficulty swallowing and has been unable to take any of her medicines. She is also noticed jaw neck swelling on the right side. She denies any sick contacts. No ear pain, congestion, cough, shortness of breath.  HPI  Past Medical History:  Diagnosis Date  . Hypertension     There are no active problems to display for this patient.   Past Surgical History:  Procedure Laterality Date  . ABDOMINAL HYSTERECTOMY      OB History    No data available       Home Medications    Prior to Admission medications   Medication Sig Start Date End Date Taking? Authorizing Provider  azithromycin (ZITHROMAX Z-PAK) 250 MG tablet Take as directed on pack 12/01/15   Linna Hoff, MD  cetirizine (ZYRTEC) 10 MG tablet Take 10 mg by mouth daily as needed for allergies.    [provider]  chlorpheniramine-HYDROcodone (TUSSIONEX PENNKINETIC ER) 10-8 MG/5ML SUER Take 5 mLs by mouth every 12 (twelve) hours as needed for cough. 12/01/15   Linna Hoff, MD  oxyCODONE-acetaminophen (PERCOCET/ROXICET) 5-325 MG per tablet Take 1-2 tablets by mouth every 6 (six) hours as needed. 01/17/14   Marlon Pel, PA-C  promethazine (PHENERGAN) 25 MG tablet Take 1 tablet (25 mg total) by mouth every 6 (six) hours as needed for nausea or vomiting. 01/17/14   Marlon Pel, PA-C  triamterene-hydrochlorothiazide (MAXZIDE-25) 37.5-25 MG per tablet Take 1 each (1 tablet total) by mouth daily. 11/27/12   Moreno-Coll, Adlih, MD    Family History History reviewed. No pertinent family  history.  Social History Social History  Substance Use Topics  . Smoking status: Never Smoker  . Smokeless tobacco: Not on file  . Alcohol use Yes     Comment: occasionally      Allergies   Patient has no known allergies.   Review of Systems Review of Systems  Constitutional: Positive for fever.  HENT: Positive for sore throat and trouble swallowing. Negative for congestion, ear pain, rhinorrhea, sinus pain and voice change.   Respiratory: Negative for cough and shortness of breath.   All other systems reviewed and are negative.    Physical Exam Updated Vital Signs BP 112/88 (BP Location: Right Arm)   Pulse 92   Temp 97.3 F (36.3 C) (Oral)   Resp 18   Ht 5\' 6"  (1.676 m)   Wt 67.1 kg (148 lb)   SpO2 98%   BMI 23.89 kg/m   Physical Exam  Constitutional: She is oriented to person, place, and time. She appears well-developed and well-nourished. No distress.  Calm, cooperative. Repeatedly asking for pain medicine  HENT:  Head: Normocephalic and atraumatic.  Mouth/Throat: Mucous membranes are normal. There is trismus in the jaw. Posterior oropharyngeal edema, posterior oropharyngeal erythema and tonsillar abscesses (R sided) present. No oropharyngeal exudate.  Eyes: Conjunctivae are normal. Pupils are equal, round, and reactive to light. Right eye exhibits no discharge. Left eye exhibits no discharge. No scleral icterus.  Neck: Normal range of motion.  Right sided neck swelling  Cardiovascular: Normal rate and regular rhythm.  Exam reveals no gallop and no friction rub.   No murmur heard. Pulmonary/Chest: Effort normal and breath sounds normal. No respiratory distress. She has no wheezes. She has no rales. She exhibits no tenderness.  Abdominal: She exhibits no distension.  Lymphadenopathy:    She has cervical adenopathy.  Neurological: She is alert and oriented to person, place, and time.  Skin: Skin is warm and dry.  Psychiatric: She has a normal mood and affect.  Her behavior is normal.  Nursing note and vitals reviewed.    ED Treatments / Results  Labs (all labs ordered are listed, but only abnormal results are displayed) Labs Reviewed  BASIC METABOLIC PANEL - Abnormal; Notable for the following:       Result Value   Potassium 2.9 (*)    Glucose, Bld 130 (*)    All other components within normal limits  CBC WITH DIFFERENTIAL/PLATELET - Abnormal; Notable for the following:    WBC 11.1 (*)    RBC 3.74 (*)    Hemoglobin 11.7 (*)    HCT 35.9 (*)    Neutro Abs 8.5 (*)    All other components within normal limits  RAPID STREP SCREEN (NOT AT [facility])  CULTURE, GROUP A STREP [facility])    EKG  EKG Interpretation None       Radiology No results found.  Procedures .Marland KitchenIncision and Drainage Date/Time: 03/04/2017 12:58 PM Performed by: Terance Hart MARIE Authorized by: Derwood Kaplan   Consent:    Consent obtained:  Verbal   Consent given by:  Patient   Risks discussed:  Bleeding and pain   Alternatives discussed:  No treatment and referral Location:    Type:  Abscess   Location: Right peritonsillar. Anesthesia (see MAR for exact dosages):    Anesthesia method:  Topical application   Topical anesthesia: Cetacaine. Procedure type:    Complexity:  Complex Procedure details:    Needle aspiration: yes     Needle size:  20 G   Incision types:  Stab incision   Incision depth:  Submucosal   Scalpel blade:  11   Wound management:  Probed and deloculated   Drainage:  Purulent and bloody   Drainage amount:  Moderate   Wound treatment:  Wound left open   Packing materials:  None Post-procedure details:    Patient tolerance of procedure:  Tolerated well, no immediate complications    (including critical care time)    Medications Ordered in ED Medications  morphine 4 MG/ML injection 4 mg (4 mg Intravenous Given 03/04/17 1009)  clindamycin (CLEOCIN) IVPB 600 mg (0 mg Intravenous Stopped 03/04/17 1039)  dexamethasone (DECADRON) injection  10 mg (10 mg Intravenous Given 03/04/17 1009)  butamben-tetracaine-benzocaine (CETACAINE) spray 1 spray (1 spray Topical Given by Other 03/04/17 1030)    Initial Impression / Assessment and Plan / ED Course  I have reviewed the triage vital signs and the nursing notes.  Pertinent labs & imaging results that were available during my care of the patient were reviewed by me and considered in my medical decision making (see chart for details).  56 year old female with R PTA. Vital signs are normal. CBC remarkable for mild leukocytosis and mild anemia. BMP remarkable hypokalemia (2.9) and hyperglycemia (130). Rapid strep was negative. Pain medicine and antibiotics and steroids were started.   I&D was performed by me and Dr. Rhunette Croft was present in the room. She tolerated well. Afterwards she  tolerated PO. Rx for Clindamycin and pain medicine given. Return precautions given.  Final Clinical Impressions(s) / ED Diagnoses   Final diagnoses:  Peritonsillar abscess    New Prescriptions New Prescriptions   No medications on file     Beryle QuantGekas, Delenn Ahn Marie, PA-C 03/04/17 1318    Derwood KaplanNanavati, Ankit, MD 03/04/17 [phone]

## 2017-03-06 LAB — CULTURE, GROUP A STREP (THRC)

## 2017-10-11 ENCOUNTER — Ambulatory Visit (HOSPITAL_COMMUNITY)
Admission: EM | Admit: 2017-10-11 | Discharge: 2017-10-11 | Disposition: A | Discharge status: Home / Self Care | Payer: BLUE CROSS/BLUE SHIELD | Attending: Family Medicine | Admitting: Family Medicine

## 2017-10-11 ENCOUNTER — Encounter (HOSPITAL_COMMUNITY): Payer: Self-pay | Admitting: Family Medicine

## 2017-10-11 DIAGNOSIS — R591 Generalized enlarged lymph nodes: Secondary | ICD-10-CM

## 2017-10-11 NOTE — ED Triage Notes (Signed)
Pt here for lymphadenopathy x 2 days. Denies any sore throat, pain or fevers. Hx of the same with tonsilitis

## 2017-10-15 NOTE — ED Provider Notes (Signed)
  [facility]   [id number] 10/11/17 Arrival Time: 1617  ASSESSMENT & PLAN:  1. Lymphadenopathy    Observe. Likely viral infection. OTC analgesics and throat care as needed  Reviewed expectations re: course of current medical issues. Questions answered. Outlined signs and symptoms indicating need for more acute intervention. Patient verbalized understanding. After Visit Summary given.   SUBJECTIVE:  Laura LennoxCarolyn Y Vega is a 57 y.o. female who reports "swollen bumps on my neck". Onset gradual beginning 2 days ago. Some nasal congestion. No ST. No respiratory symptoms. Normal PO intake. Fever reported: no. No associated n/v/abdominal symptoms. Sick contacts: none.  OTC treatment: None.  ROS: As per HPI.   OBJECTIVE:  Vitals:   10/11/17 1652  BP: 112/80  Pulse: 82  Resp: 18  Temp: 98.8 F (37.1 C)  SpO2: 98%    General appearance: alert; no distress HEENT: throat with normal; uvula midline Neck: supple with FROM; small cervical LAD, slightly tender Lungs: clear to auscultation bilaterally Skin: reveals no rash; warm and dry Psychological: alert and cooperative; normal mood and affect  No Known Allergies  Past Medical History:  Diagnosis Date  . Hypertension    Social History   Socioeconomic History  . Marital status: Single    Spouse name: Not on file  . Number of children: Not on file  . Years of education: Not on file  . Highest education level: Not on file  Social Needs  . Financial resource strain: Not on file  . Food insecurity - worry: Not on file  . Food insecurity - inability: Not on file  . Transportation needs - medical: Not on file  . Transportation needs - non-medical: Not on file  Occupational History  . Not on file  Tobacco Use  . Smoking status: Never Smoker  Substance and Sexual Activity  . Alcohol use: Yes    Comment: occasionally   . Drug use: No  . Sexual activity: Not on file  Other Topics Concern  . Not on file    Social History Narrative  . Not on file   History reviewed. No pertinent family history.        Laura LaymanHagler, [name], MD 10/15/17 [phone]

## 2018-01-21 ENCOUNTER — Other Ambulatory Visit: Payer: Self-pay | Admitting: Nurse Practitioner

## 2018-01-21 DIAGNOSIS — Z1231 Encounter for screening mammogram for malignant neoplasm of breast: Secondary | ICD-10-CM

## 2018-02-12 ENCOUNTER — Ambulatory Visit
Admission: RE | Admit: 2018-02-12 | Discharge: 2018-02-12 | Disposition: A | Discharge status: Home / Self Care | Payer: BLUE CROSS/BLUE SHIELD | Source: Ambulatory Visit | Attending: Internal Medicine | Admitting: Internal Medicine

## 2018-02-12 DIAGNOSIS — Z1231 Encounter for screening mammogram for malignant neoplasm of breast: Secondary | ICD-10-CM

## 2019-11-27 ENCOUNTER — Ambulatory Visit: Payer: Self-pay | Attending: Family

## 2019-11-27 DIAGNOSIS — Z23 Encounter for immunization: Secondary | ICD-10-CM

## 2019-11-27 NOTE — Progress Notes (Signed)
   Covid-19 Vaccination Clinic  Name:  Laura Vega    MRN: [id number] DOB: February 15, 1961  11/27/2019  [name] was observed post Covid-19 immunization for 15 minutes without incident. She was provided with Vaccine Information Sheet and instruction to access the V-Safe system.   [name] was instructed to call 911 with any severe reactions post vaccine: Marland Kitchen Difficulty breathing  . Swelling of face and throat  . A fast heartbeat  . A bad rash all over body  . Dizziness and weakness   Immunizations Administered    Name Date Dose VIS Date Route   Moderna COVID-19 Vaccine 11/27/2019  1:35 PM 0.5 mL 08/05/2019 Intramuscular   Manufacturer: Moderna   Lot: [id number]   NDC: 76394-320-03

## 2019-12-02 ENCOUNTER — Ambulatory Visit: Payer: BLUE CROSS/BLUE SHIELD

## 2019-12-30 ENCOUNTER — Ambulatory Visit: Payer: Self-pay | Attending: Family

## 2019-12-30 DIAGNOSIS — Z23 Encounter for immunization: Secondary | ICD-10-CM

## 2019-12-30 NOTE — Progress Notes (Signed)
   Covid-19 Vaccination Clinic  Name:  Laura Vega    MRN: [id number] DOB: 1961/08/21  12/30/2019  [name] was observed post Covid-19 immunization for 15 minutes without incident. She was provided with Vaccine Information Sheet and instruction to access the V-Safe system.   [name] was instructed to call 911 with any severe reactions post vaccine: Marland Kitchen Difficulty breathing  . Swelling of face and throat  . A fast heartbeat  . A bad rash all over body  . Dizziness and weakness   Immunizations Administered    Name Date Dose VIS Date Route   Moderna COVID-19 Vaccine 12/30/2019 10:27 AM 0.5 mL 08/2019 Intramuscular   Manufacturer: Moderna   Lot: [id number]   NDC: 71062-694-85

## 2020-05-27 ENCOUNTER — Other Ambulatory Visit: Payer: Self-pay

## 2020-05-27 ENCOUNTER — Ambulatory Visit (HOSPITAL_COMMUNITY): Payer: BLUE CROSS/BLUE SHIELD

## 2020-05-27 ENCOUNTER — Ambulatory Visit (HOSPITAL_COMMUNITY)
Admission: EM | Admit: 2020-05-27 | Discharge: 2020-05-27 | Disposition: A | Discharge status: Home / Self Care | Payer: BLUE CROSS/BLUE SHIELD | Attending: Emergency Medicine | Admitting: Emergency Medicine

## 2020-05-27 ENCOUNTER — Encounter (HOSPITAL_COMMUNITY): Payer: Self-pay | Admitting: *Deleted

## 2020-05-27 DIAGNOSIS — M25572 Pain in left ankle and joints of left foot: Secondary | ICD-10-CM | POA: Diagnosis not present

## 2020-05-27 HISTORY — DX: Allergy, unspecified, initial encounter: T78.40XA

## 2020-05-27 MED ORDER — CELECOXIB 50 MG PO CAPS: 50.0000 mg | ORAL_CAPSULE | Freq: Two times a day (BID) | ORAL | 0 refills | Status: AC

## 2020-05-27 NOTE — Discharge Instructions (Addendum)
Take Celebrex for pain and inflammation.

## 2020-05-27 NOTE — ED Triage Notes (Signed)
Patient in with complaints of swelling and pain in the inner left foot x 1 week. Patient states that she does have some pain with walking. Redness and swelling noted.

## 2020-05-27 NOTE — ED Provider Notes (Signed)
Emergency Department Provider Note  ____________________________________________  Time seen: Approximately 5:17 PM  I have reviewed the triage vital signs and the nursing notes.   HISTORY  Chief Complaint Foot Pain   Historian Patient     HPI Laura Vega is a 59 y.o. female presents to the emergency department with acute left foot pain for the past week.  Patient localizes pain over the medial malleolus.  Patient states that it has been difficult for her to bear weight.  Patient states that she might have tripped but does not recall any falls or other forms of trauma.  No numbness or tingling of the left lower extremity.  No abrasions or lacerations.  No similar injuries in the past.  No prior left foot fractures.   Past Medical History:  Diagnosis Date  . Allergies   . Hypertension      Immunizations up to date:  Yes.     Past Medical History:  Diagnosis Date  . Allergies   . Hypertension     There are no problems to display for this patient.   Past Surgical History:  Procedure Laterality Date  . ABDOMINAL HYSTERECTOMY      Prior to Admission medications   Medication Sig Start Date End Date Taking? Authorizing Provider  cetirizine (ZYRTEC) 10 MG tablet Take 10 mg by mouth daily as needed for allergies.   Yes [provider]  triamterene-hydrochlorothiazide (MAXZIDE-25) 37.5-25 MG per tablet Take 1 each (1 tablet total) by mouth daily. 11/27/12  Yes Moreno-Coll, Adlih, MD  celecoxib (CELEBREX) 50 MG capsule Take 1 capsule (50 mg total) by mouth 2 (two) times daily for 10 days. 05/27/20 06/06/20  Orvil Feil, PA-C    Allergies Patient has no known allergies.  Family History  Problem Relation Age of Onset  . Cancer Mother   . Kidney disease Mother   . Cancer Sister     Social History Social History   Tobacco Use  . Smoking status: Current Some Day Smoker  . Smokeless tobacco: Never Used  Vaping Use  . Vaping Use: Never used   Substance Use Topics  . Alcohol use: Yes    Alcohol/week: 16.0 standard drinks    Types: 16 Cans of beer per week  . Drug use: No     Review of Systems  Constitutional: No fever/chills Eyes:  No discharge ENT: No upper respiratory complaints. Respiratory: no cough. No SOB/ use of accessory muscles to breath Gastrointestinal:   No nausea, no vomiting.  No diarrhea.  No constipation. Musculoskeletal: Patient has left foot pain.  Skin: Negative for rash, abrasions, lacerations, ecchymosis.    ____________________________________________   PHYSICAL EXAM:  VITAL SIGNS: ED Triage Vitals  Enc Vitals Group     BP 05/27/20 1706 (!) 148/84     Pulse Rate 05/27/20 1706 68     Resp 05/27/20 1706 16     Temp 05/27/20 1706 98.1 F (36.7 C)     Temp Source 05/27/20 1706 Oral     SpO2 05/27/20 1706 96 %     Weight --      Height --      Head Circumference --      Peak Flow --      Pain Score 05/27/20 1707 4     Pain Loc --      Pain Edu? --      Excl. in GC? --      Constitutional: Alert and oriented. Well appearing and in no  acute distress. Eyes: Conjunctivae are normal. PERRL. EOMI. Head: Atraumatic. Cardiovascular: Normal rate, regular rhythm. Normal S1 and S2.  Good peripheral circulation. Respiratory: Normal respiratory effort without tachypnea or retractions. Lungs CTAB. Good air entry to the bases with no decreased or absent breath sounds Gastrointestinal: Bowel sounds x 4 quadrants. Soft and nontender to palpation. No guarding or rigidity. No distention. Musculoskeletal: Patient performs full range of motion at the left ankle.  She does have reproducible tenderness to palpation over the medial malleolus.  She can move all 5 left toes.  Palpable dorsalis pedis pulse, left.  Capillary refill less than 2 seconds on the left. Neurologic:  Normal for age. No gross focal neurologic deficits are appreciated.  Skin:  Skin is warm, dry and intact. No rash noted. Psychiatric:  Mood and affect are normal for age. Speech and behavior are normal.   ____________________________________________   LABS (all labs ordered are listed, but only abnormal results are displayed)  Labs Reviewed - No data to display ____________________________________________  EKG   ____________________________________________  RADIOLOGY    No results found.  ____________________________________________    PROCEDURES  Procedure(s) performed:     Procedures     Medications - No data to display   ____________________________________________   INITIAL IMPRESSION / ASSESSMENT AND PLAN / ED COURSE  Pertinent labs & imaging results that were available during my care of the patient were reviewed by me and considered in my medical decision making (see chart for details).      Assessment and Plan: Left foot pain:  59 year old female presents to the emergency department with acute left foot pain for approximately 1 week.  Vital signs were reassuring at triage.  On physical exam, patient was alert and active and was able to perform full range of motion of the left ankle.  She had reproducible tenderness to palpation over the medial malleolus.    Differential diagnosis includes osteoarthritis, gout, ankle sprain, fracture....  Will obtain x-rays of the left ankle will reassess.  Patient decided that she needed to leave urgent care before x-rays were complete.  She was discharged with Celebrex to be taken twice daily as needed for foot pain.  Cautioned patient that diagnostically we would limited with lack of x-rays.  Return precautions were given to return with new or worsening symptoms. ____________________________________________  FINAL CLINICAL IMPRESSION(S) / ED DIAGNOSES  Final diagnoses:  Pain of joint of left ankle and foot      NEW MEDICATIONS STARTED DURING THIS VISIT:  ED Discharge Orders         Ordered    celecoxib (CELEBREX) 50 MG capsule  2  times daily        05/27/20 1744              This chart was dictated using voice recognition software/Dragon. Despite best efforts to proofread, errors can occur which can change the meaning. Any change was purely unintentional.     Orvil Feil, PA-C 05/27/20 1858

## 2020-09-15 ENCOUNTER — Emergency Department (HOSPITAL_COMMUNITY): Payer: BLUE CROSS/BLUE SHIELD

## 2020-09-15 ENCOUNTER — Observation Stay (HOSPITAL_COMMUNITY)
Admission: EM | Admit: 2020-09-15 | Discharge: 2020-09-16 | Disposition: A | Discharge status: Home / Self Care | Payer: BLUE CROSS/BLUE SHIELD | Attending: General Surgery | Admitting: General Surgery

## 2020-09-15 ENCOUNTER — Encounter (HOSPITAL_COMMUNITY): Payer: Self-pay

## 2020-09-15 DIAGNOSIS — F17200 Nicotine dependence, unspecified, uncomplicated: Secondary | ICD-10-CM | POA: Insufficient documentation

## 2020-09-15 DIAGNOSIS — Z79899 Other long term (current) drug therapy: Secondary | ICD-10-CM | POA: Diagnosis not present

## 2020-09-15 DIAGNOSIS — R109 Unspecified abdominal pain: Secondary | ICD-10-CM | POA: Diagnosis present

## 2020-09-15 DIAGNOSIS — I10 Essential (primary) hypertension: Secondary | ICD-10-CM | POA: Diagnosis not present

## 2020-09-15 DIAGNOSIS — U071 COVID-19: Secondary | ICD-10-CM | POA: Diagnosis not present

## 2020-09-15 DIAGNOSIS — K56609 Unspecified intestinal obstruction, unspecified as to partial versus complete obstruction: Secondary | ICD-10-CM | POA: Diagnosis not present

## 2020-09-15 LAB — RESP PANEL BY RT-PCR (FLU A&B, COVID) ARPGX2
Influenza A by PCR: NEGATIVE
Influenza B by PCR: NEGATIVE
SARS Coronavirus 2 by RT PCR: POSITIVE — AB

## 2020-09-15 LAB — HIV ANTIBODY (ROUTINE TESTING W REFLEX): HIV Screen 4th Generation wRfx: NONREACTIVE

## 2020-09-15 LAB — COMPREHENSIVE METABOLIC PANEL
ALT: 27 U/L (ref 0–44)
AST: 27 U/L (ref 15–41)
Albumin: 4 g/dL (ref 3.5–5.0)
Alkaline Phosphatase: 49 U/L (ref 38–126)
Anion gap: 15 (ref 5–15)
BUN: 14 mg/dL (ref 6–20)
CO2: 25 mmol/L (ref 22–32)
Calcium: 10.5 mg/dL — ABNORMAL HIGH (ref 8.9–10.3)
Chloride: 100 mmol/L (ref 98–111)
Creatinine, Ser: 0.67 mg/dL (ref 0.44–1.00)
GFR, Estimated: >60 mL/min (ref >60)
Glucose, Bld: 182 mg/dL — ABNORMAL HIGH (ref 70–99)
Potassium: 2.8 mmol/L — ABNORMAL LOW (ref 3.5–5.1)
Sodium: 140 mmol/L (ref 135–145)
Total Bilirubin: 0.3 mg/dL (ref 0.3–1.2)
Total Protein: 7.9 g/dL (ref 6.5–8.1)

## 2020-09-15 LAB — CBC
HCT: 37 % (ref 36.0–46.0)
HCT: 38.7 % (ref 36.0–46.0)
Hemoglobin: 12.7 g/dL (ref 12.0–15.0)
Hemoglobin: 13.1 g/dL (ref 12.0–15.0)
MCH: 32.4 pg (ref 26.0–34.0)
MCH: 32 pg (ref 26.0–34.0)
MCHC: 34.3 g/dL (ref 30.0–36.0)
MCHC: 33.9 g/dL (ref 30.0–36.0)
MCV: 94.4 fL (ref 80.0–100.0)
MCV: 94.4 fL (ref 80.0–100.0)
Platelets: 289 10*3/uL (ref 150–400)
Platelets: 286 10*3/uL (ref 150–400)
RBC: 3.92 MIL/uL (ref 3.87–5.11)
RBC: 4.1 MIL/uL (ref 3.87–5.11)
RDW: 11.9 % (ref 11.5–15.5)
RDW: 12 % (ref 11.5–15.5)
WBC: 11.8 10*3/uL — ABNORMAL HIGH (ref 4.0–10.5)
WBC: 9.6 10*3/uL (ref 4.0–10.5)
nRBC: 0 % (ref 0.0–0.2)
nRBC: 0 % (ref 0.0–0.2)

## 2020-09-15 LAB — PROTIME-INR
INR: 1 (ref 0.8–1.2)
Prothrombin Time: 12.6 seconds (ref 11.4–15.2)

## 2020-09-15 LAB — I-STAT BETA HCG BLOOD, ED (MC, WL, AP ONLY): I-stat hCG, quantitative: <5 m[IU]/mL (ref <5)

## 2020-09-15 LAB — D-DIMER, QUANTITATIVE: D-Dimer, Quant: 2.19 ug/mL-FEU — ABNORMAL HIGH (ref 0.00–0.50)

## 2020-09-15 LAB — BRAIN NATRIURETIC PEPTIDE: B Natriuretic Peptide: 60.5 pg/mL (ref 0.0–100.0)

## 2020-09-15 LAB — PHOSPHORUS: Phosphorus: 3.4 mg/dL (ref 2.5–4.6)

## 2020-09-15 LAB — PROCALCITONIN: Procalcitonin: <0.1 ng/mL

## 2020-09-15 LAB — TYPE AND SCREEN
ABO/RH(D): O POS
Antibody Screen: NEGATIVE

## 2020-09-15 LAB — CREATININE, SERUM
Creatinine, Ser: 0.72 mg/dL (ref 0.44–1.00)
GFR, Estimated: >60 mL/min (ref >60)

## 2020-09-15 LAB — ABO/RH: ABO/RH(D): O POS

## 2020-09-15 LAB — TROPONIN I (HIGH SENSITIVITY)
Troponin I (High Sensitivity): 4 ng/L (ref <18)
Troponin I (High Sensitivity): 5 ng/L (ref <18)

## 2020-09-15 LAB — C-REACTIVE PROTEIN: CRP: 3.7 mg/dL — ABNORMAL HIGH (ref <1.0)

## 2020-09-15 LAB — MAGNESIUM: Magnesium: 2.2 mg/dL (ref 1.7–2.4)

## 2020-09-15 LAB — LACTATE DEHYDROGENASE: LDH: 164 U/L (ref 98–192)

## 2020-09-15 LAB — FIBRINOGEN: Fibrinogen: 398 mg/dL (ref 210–475)

## 2020-09-15 LAB — FERRITIN: Ferritin: 28 ng/mL (ref 11–307)

## 2020-09-15 LAB — LIPASE, BLOOD: Lipase: 28 U/L (ref 11–51)

## 2020-09-15 LAB — HEPATITIS B SURFACE ANTIGEN: Hepatitis B Surface Ag: NONREACTIVE

## 2020-09-15 MED ORDER — ONDANSETRON HCL 4 MG/2ML IJ SOLN: 4.0000 mg | Freq: Four times a day (QID) | INTRAMUSCULAR | Status: DC | PRN

## 2020-09-15 MED ORDER — SODIUM CHLORIDE 0.9 % IV BOLUS
1000.0000 mL | Freq: Once | INTRAVENOUS | Status: AC
  Administered 2020-09-15: 1000 mL via INTRAVENOUS

## 2020-09-15 MED ORDER — HEPARIN SODIUM (PORCINE) 5000 UNIT/ML IJ SOLN
5000.0000 [IU] | Freq: Three times a day (TID) | INTRAMUSCULAR | Status: DC
  Administered 2020-09-15 – 2020-09-16 (×2): 5000 [IU] via SUBCUTANEOUS
  Filled 2020-09-15 (×2): qty 1

## 2020-09-15 MED ORDER — ONDANSETRON 4 MG PO TBDP: 4.0000 mg | ORAL_TABLET | Freq: Four times a day (QID) | ORAL | Status: DC | PRN

## 2020-09-15 MED ORDER — POTASSIUM CHLORIDE IN NACL 40-0.9 MEQ/L-% IV SOLN
INTRAVENOUS | Status: DC
  Filled 2020-09-15 (×3): qty 1000

## 2020-09-15 MED ORDER — POTASSIUM CHLORIDE 10 MEQ/100ML IV SOLN
10.0000 meq | Freq: Once | INTRAVENOUS | Status: AC
  Administered 2020-09-15: 10 meq via INTRAVENOUS
  Filled 2020-09-15: qty 100

## 2020-09-15 MED ORDER — POTASSIUM CHLORIDE CRYS ER 20 MEQ PO TBCR
40.0000 meq | EXTENDED_RELEASE_TABLET | Freq: Once | ORAL | Status: AC
  Administered 2020-09-15: 40 meq via ORAL
  Filled 2020-09-15: qty 2

## 2020-09-15 MED ORDER — IOHEXOL 300 MG/ML  SOLN
100.0000 mL | Freq: Once | INTRAMUSCULAR | Status: AC | PRN
  Administered 2020-09-15: 100 mL via INTRAVENOUS

## 2020-09-15 MED ORDER — LORATADINE 10 MG PO TABS
10.0000 mg | ORAL_TABLET | Freq: Every day | ORAL | Status: DC
  Administered 2020-09-16: 10 mg via ORAL
  Filled 2020-09-15: qty 1

## 2020-09-15 MED ORDER — DIPHENHYDRAMINE HCL 50 MG/ML IJ SOLN: 12.5000 mg | Freq: Four times a day (QID) | INTRAMUSCULAR | Status: DC | PRN

## 2020-09-15 MED ORDER — PANTOPRAZOLE SODIUM 40 MG IV SOLR
40.0000 mg | Freq: Once | INTRAVENOUS | Status: AC
  Administered 2020-09-15: 40 mg via INTRAVENOUS
  Filled 2020-09-15: qty 40

## 2020-09-15 MED ORDER — DIPHENHYDRAMINE HCL 12.5 MG/5ML PO ELIX: 12.5000 mg | ORAL_SOLUTION | Freq: Four times a day (QID) | ORAL | Status: DC | PRN

## 2020-09-15 MED ORDER — KCL IN DEXTROSE-NACL 40-5-0.9 MEQ/L-%-% IV SOLN
INTRAVENOUS | Status: DC
  Filled 2020-09-15: qty 1000

## 2020-09-15 MED ORDER — TRIAMTERENE-HCTZ 37.5-25 MG PO TABS
1.0000 | ORAL_TABLET | Freq: Every day | ORAL | Status: DC
  Administered 2020-09-16: 1 via ORAL
  Filled 2020-09-15: qty 1

## 2020-09-15 NOTE — ED Notes (Signed)
Pt able to ambulate to the bathroom, w/no issues at this time.

## 2020-09-15 NOTE — ED Notes (Signed)
Attempted to call report

## 2020-09-15 NOTE — ED Provider Notes (Addendum)
[facility] EMERGENCY DEPARTMENT Provider Note   CSN: [id number] Arrival date & time: 09/15/20  0319     History Chief Complaint  Patient presents with  . Abdominal Pain    Laura Vega is a 60 y.o. female history of hypertension, hysterectomy.  Patient arrives for abdominal pain nausea vomiting onset last night.  Patient reports that she ate some fast food symptoms began soon after.  She describes right lower quadrant abdominal pain constant moderate-severe in intensity sharp radiating up into her right upper quadrant no clear aggravating or alleviating factors.  Patient reports that she had 1 liquor drink and 1 beer last night.  She reports similar pain in the past but never this severe and normally it resolves quicker.  She reports multiple episodes of nonbloody nonbilious emesis since last night.  She reports her last bowel movement was last night and was normal.  She denies fever/chills, chest pain/shortness of breath, cough, dysuria/hematuria or any additional concerns.  Of note patient reports she has had 2 COVID vaccines but multiple coworkers have had COVID in the past few days.  HPI     Past Medical History:  Diagnosis Date  . Allergies   . Hypertension     There are no problems to display for this patient.   Past Surgical History:  Procedure Laterality Date  . ABDOMINAL HYSTERECTOMY       OB History   No obstetric history on file.     Family History  Problem Relation Age of Onset  . Cancer Mother   . Kidney disease Mother   . Cancer Sister     Social History   Tobacco Use  . Smoking status: Current Some Day Smoker  . Smokeless tobacco: Never Used  Vaping Use  . Vaping Use: Never used  Substance Use Topics  . Alcohol use: Yes    Alcohol/week: 16.0 standard drinks    Types: 16 Cans of beer per week  . Drug use: No    Home Medications Prior to Admission medications   Medication Sig Start Date End Date Taking?  Authorizing Provider  cetirizine (ZYRTEC) 10 MG tablet Take 10 mg by mouth daily as needed for allergies.   Yes [provider]  triamterene-hydrochlorothiazide (MAXZIDE-25) 37.5-25 MG tablet Take 1 tablet by mouth daily.   Yes [provider]    Allergies    Patient has no known allergies.  Review of Systems   Review of Systems Ten systems are reviewed and are negative for acute change except as noted in the HPI  Physical Exam Updated Vital Signs BP 132/85   Pulse 96   Temp 98.5 F (36.9 C) (Oral)   Resp 15   SpO2 98%   Physical Exam Constitutional:      General: She is not in acute distress.    Appearance: Normal appearance. She is well-developed. She is not ill-appearing or diaphoretic.  HENT:     Head: Normocephalic and atraumatic.  Eyes:     General: Vision grossly intact. Gaze aligned appropriately.     Pupils: Pupils are equal, round, and reactive to light.  Neck:     Trachea: Trachea and phonation normal.  Cardiovascular:     Rate and Rhythm: Normal rate and regular rhythm.  Pulmonary:     Effort: Pulmonary effort is normal. No respiratory distress.     Breath sounds: Normal breath sounds.  Abdominal:     General: There is no distension.     Palpations:  Abdomen is soft.     Tenderness: There is abdominal tenderness in the right upper quadrant and right lower quadrant. There is guarding. There is no rebound.  Musculoskeletal:        General: Normal range of motion.     Cervical back: Normal range of motion.  Skin:    General: Skin is warm and dry.  Neurological:     Mental Status: She is alert.     GCS: GCS eye subscore is 4. GCS verbal subscore is 5. GCS motor subscore is 6.     Comments: Speech is clear and goal oriented, follows commands Major Cranial nerves without deficit, no facial droop Moves extremities without ataxia, coordination intact  Psychiatric:        Behavior: Behavior normal.     ED Results / Procedures / Treatments    Labs (all labs ordered are listed, but only abnormal results are displayed) Labs Reviewed  RESP PANEL BY RT-PCR (FLU A&B, COVID) ARPGX2 - Abnormal; Notable for the following components:      Result Value   SARS Coronavirus 2 by RT PCR POSITIVE (*)    All other components within normal limits  COMPREHENSIVE METABOLIC PANEL - Abnormal; Notable for the following components:   Potassium 2.8 (*)    Glucose, Bld 182 (*)    Calcium 10.5 (*)    All other components within normal limits  CBC - Abnormal; Notable for the following components:   WBC 11.8 (*)    All other components within normal limits  LIPASE, BLOOD  URINALYSIS, ROUTINE W REFLEX MICROSCOPIC  I-STAT BETA HCG BLOOD, ED ([facility], [facility], [facility] ONLY)    EKG None  Radiology CT ABDOMEN PELVIS W CONTRAST  Result Date: 09/15/2020 CLINICAL DATA:  Right lower quadrant abdominal pain. EXAM: CT ABDOMEN AND PELVIS WITH CONTRAST TECHNIQUE: Multidetector CT imaging of the abdomen and pelvis was performed using the standard protocol following bolus administration of intravenous contrast. CONTRAST:  OMNIPAQUE IOHEXOL 300 MG/ML  SOLN COMPARISON:  Jan 17, 2014. FINDINGS: Lower chest: No acute abnormality. Hepatobiliary: No gallstones or biliary dilatation is noted. Stable 3 cm rounded abnormality seen in the left hepatic lobe with peripheral nodular enhancement most consistent with hemangioma. Pancreas: Unremarkable. No pancreatic ductal dilatation or surrounding inflammatory changes. Spleen: Normal in size without focal abnormality. Adrenals/Urinary Tract: Adrenal glands are unremarkable. Kidneys are normal, without renal calculi, focal lesion, or hydronephrosis. Bladder is unremarkable. Stomach/Bowel: The stomach and appendix are unremarkable. Moderately dilated distal small bowel loops are noted with transition zone seen in the pelvis involving the distal ileum best seen on image number 96 of series 6. Vascular/Lymphatic: No significant vascular findings  are present. No enlarged abdominal or pelvic lymph nodes. Reproductive: Status post hysterectomy. No adnexal masses. Other: Mild amount of free fluid is noted in the pelvis. No definite hernia is noted. Musculoskeletal: No acute or significant osseous findings. IMPRESSION: 1. Moderately dilated distal small bowel loops are noted with transition zone seen in the pelvis involving the distal ileum. This is concerning for small bowel obstruction. 2. Mild amount of free fluid is noted in the pelvis. 3. Stable 3 cm rounded abnormality seen in left hepatic lobe with peripheral nodular enhancement most consistent with hemangioma. Electronically Signed   By: Lupita Raider M.D.   On: 09/15/2020 11:49    Procedures Procedures (including critical care time)  Medications Ordered in ED Medications  pantoprazole (PROTONIX) injection 40 mg (40 mg Intravenous Given 09/15/20 0826)  sodium chloride  0.9 % bolus 1,000 mL (1,000 mLs Intravenous New Bag/Given 09/15/20 1035)  potassium chloride 10 mEq in 100 mL IVPB (10 mEq Intravenous New Bag/Given 09/15/20 1036)  iohexol (OMNIPAQUE) 300 MG/ML solution 100 mL (100 mLs Intravenous Contrast Given 09/15/20 1137)    ED Course  I have reviewed the triage vital signs and the nursing notes.  Pertinent labs & imaging results that were available during my care of the patient were reviewed by me and considered in my medical decision making (see chart for details).    MDM Rules/Calculators/A&P                         Additional history obtained from: 1. Nursing notes from this visit. 2. Review of electronic medical records. ----------------------- I ordered, reviewed and interpreted labs which include: CBC shows leukocytosis of 11.8, no anemia. Lipase within normal limits. Pregnancy test negative. CMP shows hypokalemia of 2.8, no emergent electrolyte derangement, AKI, LFT elevations were.  We will begin potassium repletion in ED. COVID test positive,  incidental? Urinalysis pending  CTAP:  IMPRESSION:  1. Moderately dilated distal small bowel loops are noted with  transition zone seen in the pelvis involving the distal ileum. This  is concerning for small bowel obstruction.  2. Mild amount of free fluid is noted in the pelvis.  3. Stable 3 cm rounded abnormality seen in left hepatic lobe with  peripheral nodular enhancement most consistent with hemangioma.   NG tube ordered, consult placed to general surgery. - 12:08 PM: Consult with Will PA-C from general surgery, advises they will be down to see patient shortly and will admit patient to their service. - 12:11 PM: Patient updated on findings she states understanding she is agreeable General surgery consultation, NG tube and admission.  She has no further questions or concerns. -------- Addendum: General surgery asked that I page hospitalist service to consult given patient's COVID-positive status.  I spoke with Dr. Chipper Herb who will consult on patient.   Laura Vega was evaluated in Emergency Department on 09/15/2020 for the symptoms described in the history of present illness. She was evaluated in the context of the global COVID-19 pandemic, which necessitated consideration that the patient might be at risk for infection with the SARS-CoV-2 virus that causes COVID-19. Institutional protocols and algorithms that pertain to the evaluation of patients at risk for COVID-19 are in a state of rapid change based on information released by regulatory bodies including the CDC and federal and state organizations. These policies and algorithms were followed during the patient's care in the ED.  Note: Portions of this report may have been transcribed using voice recognition software. Every effort was made to ensure accuracy; however, inadvertent computerized transcription errors may still be present. Final Clinical Impression(s) / ED Diagnoses Final diagnoses:  SBO (small bowel obstruction)  (HCC)  COVID-19 virus infection    Rx / DC Orders ED Discharge Orders    None       Elizabeth Palau 09/15/20 1212    Bill Salinas, PA-C 09/15/20 1213    Elizabeth Palau 09/15/20 1338    Vanetta Mulders, MD 09/23/20 [phone]

## 2020-09-15 NOTE — Consult Note (Signed)
[facility] Medical Consultation  Laura Vega JSE:[id number] DOB: 1961-04-19 DOA: 09/15/2020 PCP: Burtis Junes, MD (Inactive)   Requesting physician: CCS Date of consultation: 09/15/2020 Reason for consultation: COVID screening positive  Impression/Recommendations Active Problems:   * No active hospital problems. *   COVID infection -Rather asymptomatic, no s/s of PNA, will monitor off anti-viral and steroid treatment.  -COVID lab sent, will follow.  Hypokalemia -IV replace -Check Mg and Phos  Elevated glucose -A1C  SBO -Likely from adhesion of intestine which can be related to her past hysterectomy -Management as per primary team.  I will followup again tomorrow. Please contact me if I can be of assistance in the meanwhile. Thank you for this consultation.  Chief Complaint: Feeling better   HPI:  60 year old female with past medical history of HTN, seasonal allergy, presented with new onset of right-sided abdominal pain.  Patient works as [employer], recently several of her coworkers tested positive for [employer].  For that reason, she was asked to have a COVID screening yesterday and turned positive, so she was asked to be quarantined for 10 days at home.  She denies any fever chills, no cough no shortness of breath.  Yesterday evening, after eating dinner, she started to feel right-sided abdominal pain cramping-like, associated with a feeling of nausea but no vomiting and the pain became constant earlier this morning which prompted her to come to the ED.  She had 1 small loose bowel movement last night, which did not ease the pain.  Patient was vaccinated x2 with Moderna last year.  ED work-up: Vital signs stable, no hypoxia no tachypnea or tachycardia, blood work showed elevated glucose 182, potassium 2.8.  Lipase within normal limits. CT abd, SBO, lungs clear.   Review of Systems:  14 points of review of systems done negative except those mentioned  HPI  Past Medical History:  Diagnosis Date  . Allergies   . Hypertension    Past Surgical History:  Procedure Laterality Date  . ABDOMINAL HYSTERECTOMY     Social History:  reports that she has been smoking. She has never used smokeless tobacco. She reports current alcohol use of about 16.0 standard drinks of alcohol per week. She reports that she does not use drugs.  No Known Allergies Family History  Problem Relation Age of Onset  . Cancer Mother   . Kidney disease Mother   . Cancer Sister     Prior to Admission medications   Medication Sig Start Date End Date Taking? Authorizing Provider  cetirizine (ZYRTEC) 10 MG tablet Take 10 mg by mouth daily as needed for allergies.   Yes [provider]  triamterene-hydrochlorothiazide (MAXZIDE-25) 37.5-25 MG tablet Take 1 tablet by mouth daily.   Yes [provider]   Physical Exam: Blood pressure 127/79, pulse 99, temperature 98.5 F (36.9 C), temperature source Oral, resp. rate 18, SpO2 99 %. Vitals:   09/15/20 1030 09/15/20 1215  BP: 132/85 127/79  Pulse: 96 99  Resp: 15 18  Temp:    SpO2: 98% 99%     General: No acute distress  Eyes: PERRL  ENT: Mucous membranes moist  Neck: Supple no JVD  Cardiovascular: RRR, no murmurs  Respiratory: Clear wheezing bilaterally, no crackles no wheezing  Abdomen: Soft, moderate tenderness on right lower quadrant, no rebound, no guarding.  Bowel sounds present on all 4 quadrants  Skin: No rash no cyanosis  Musculoskeletal: Normal range of motion  Psychiatric: Calm  Neurologic: No focal deficit  Labs on Admission:  Basic Metabolic Panel: Recent Labs  Lab 09/15/20 0336  NA 140  K 2.8*  CL 100  CO2 25  GLUCOSE 182*  BUN 14  CREATININE 0.67  CALCIUM 10.5*   Liver Function Tests: Recent Labs  Lab 09/15/20 0336  AST 27  ALT 27  ALKPHOS 49  BILITOT 0.3  PROT 7.9  ALBUMIN 4.0   Recent Labs  Lab 09/15/20 0336  LIPASE 28   No results for  input(s): AMMONIA in the last 168 hours. CBC: Recent Labs  Lab 09/15/20 0336  WBC 11.8*  HGB 12.7  HCT 37.0  MCV 94.4  PLT 289   Cardiac Enzymes: No results for input(s): CKTOTAL, CKMB, CKMBINDEX, TROPONINI in the last 168 hours. BNP: Invalid input(s): POCBNP CBG: No results for input(s): GLUCAP in the last 168 hours.  Radiological Exams on Admission: CT ABDOMEN PELVIS W CONTRAST  Result Date: 09/15/2020 CLINICAL DATA:  Right lower quadrant abdominal pain. EXAM: CT ABDOMEN AND PELVIS WITH CONTRAST TECHNIQUE: Multidetector CT imaging of the abdomen and pelvis was performed using the standard protocol following bolus administration of intravenous contrast. CONTRAST:  OMNIPAQUE IOHEXOL 300 MG/ML  SOLN COMPARISON:  Jan 17, 2014. FINDINGS: Lower chest: No acute abnormality. Hepatobiliary: No gallstones or biliary dilatation is noted. Stable 3 cm rounded abnormality seen in the left hepatic lobe with peripheral nodular enhancement most consistent with hemangioma. Pancreas: Unremarkable. No pancreatic ductal dilatation or surrounding inflammatory changes. Spleen: Normal in size without focal abnormality. Adrenals/Urinary Tract: Adrenal glands are unremarkable. Kidneys are normal, without renal calculi, focal lesion, or hydronephrosis. Bladder is unremarkable. Stomach/Bowel: The stomach and appendix are unremarkable. Moderately dilated distal small bowel loops are noted with transition zone seen in the pelvis involving the distal ileum best seen on image number 96 of series 6. Vascular/Lymphatic: No significant vascular findings are present. No enlarged abdominal or pelvic lymph nodes. Reproductive: Status post hysterectomy. No adnexal masses. Other: Mild amount of free fluid is noted in the pelvis. No definite hernia is noted. Musculoskeletal: No acute or significant osseous findings. IMPRESSION: 1. Moderately dilated distal small bowel loops are noted with transition zone seen in the pelvis  involving the distal ileum. This is concerning for small bowel obstruction. 2. Mild amount of free fluid is noted in the pelvis. 3. Stable 3 cm rounded abnormality seen in left hepatic lobe with peripheral nodular enhancement most consistent with hemangioma. Electronically Signed   By: Lupita Raider M.D.   On: 09/15/2020 11:49    EKG: Ordered  Time spent: 35  Emeline [facility] Pager [phone]  09/15/2020, 1:25 PM

## 2020-09-15 NOTE — ED Notes (Signed)
Restarted Iv medication and re hooked leads

## 2020-09-15 NOTE — ED Triage Notes (Signed)
Pt comes via [facility], began to have lower R sided abd pain after eating bad pork, tried to make it better by drinking beer, vomited several times, some diarrhea.

## 2020-09-15 NOTE — ED Notes (Signed)
Pt resting, appears to be comfortable.  No signs of distress noted at this time.

## 2020-09-15 NOTE — ED Notes (Signed)
Off floor to CT 

## 2020-09-15 NOTE — ED Notes (Signed)
Report called and Transport request placed.

## 2020-09-15 NOTE — ED Notes (Signed)
New bag of 40 mEq of K ordered. Current bag appears to been opened. New bag on the way

## 2020-09-15 NOTE — H&P (Signed)
[facility] Admission Note  Laura Vega 02/29/1961  [id number].    Requesting MD: Harlene Salts Chief Complaint:  RLQ abdominal pain, after eating bad pork, some diarrhea Reason for Consult: SBO  HPI:  Patient is a 60 year old female presents to the ED early this AM with RLQ pain after eating some bad pork, she also had diarrhea. She told them she tried to drink some beer to make it better, but did not.  She has had this problem before, but never this severe and it get better on it's own.  She had had multiple episodes of emesis and a BM last PM was normal.    Work up shows she is afebrile, VSS some mild tachycardia up to 106. Glucose 182, K+ 2.8, Calcium 10.5.  Lipase 28, WBC 11.8, H/H 12.7/37.0, platelets 289,000, i-STAT  HCG <5, Respiratory panel is positive for COVID.  CT abdomen shows Moderately dilated distal small bowel loops are noted with transition zone seen in the pelvis involving the distal ileum. This is concerning for small bowel obstruction.   Mild amount of free fluid is noted in the pelvis.  Stable 3 cm rounded abnormality seen in left hepatic lobe with peripheral nodular enhancement most consistent with hemangioma. Of note she has had 2 COVID vaccines shots. She still has some discomfort RLQ, but says she is better, + BM today and no current nausea or vomiting.   ROS: Review of Systems  Constitutional: Negative.   HENT: Negative.        Some sinus trouble she attributes to change of season allergies.  Eyes: Negative.   Respiratory: Negative.   Cardiovascular: Negative.   Gastrointestinal: Positive for abdominal pain, diarrhea, nausea and vomiting. Negative for blood in stool, constipation, heartburn and melena.       Pt says she had a normal BM yesterday and today.  Genitourinary: Negative.   Musculoskeletal: Negative.   Skin: Negative.   Neurological: Negative.   Endo/Heme/Allergies: Negative.   Psychiatric/Behavioral: Negative.     Family  History  Problem Relation Age of Onset  . Cancer Mother   . Kidney disease Mother   . Cancer Sister     Past Medical History:  Diagnosis Date  . Allergies   . Hypertension Hx of pancreatitis 01/17/14, quit drinking for a year      Past Surgical History:  Procedure Laterality Date  . ABDOMINAL HYSTERECTOMY      Social History:  reports that she has been smoking. She has never used smokeless tobacco. She reports current alcohol use of about 16.0 standard drinks of alcohol per week. She reports that she does not use drugs.  Allergies: No Known Allergies Tobacco: occasional ETOH;  Weekly for some time she's not sure how long DRugs:  None  She works with food service @ [employer] . Prior to Admission medications   Medication Sig Start Date End Date Taking? Authorizing Provider  cetirizine (ZYRTEC) 10 MG tablet Take 10 mg by mouth daily as needed for allergies.   Yes [provider]  triamterene-hydrochlorothiazide (MAXZIDE-25) 37.5-25 MG tablet Take 1 tablet by mouth daily.   Yes [provider]      Blood pressure 132/85, pulse 96, temperature 98.5 F (36.9 C), temperature source Oral, resp. rate 15, SpO2 98 %. Physical Exam:  General: pleasant, WD, AA female who is laying in bed in NAD, quit comfortable right now.   HEENT: head is normocephalic, atraumatic.  Sclera are noninjected.  PERRL.  Ears and nose without  any masses or lesions.  Mouth is pink and moist Heart: regular, rate, and rhythm.  Normal s1,s2. No obvious murmurs, gallops, or rubs noted.  Palpable radial and pedal pulses bilaterally Lungs: CTAB, no wheezes, rhonchi, or rales noted.  Respiratory effort nonlabored Abd: soft,still complains of some tenderness RLQ, keeps referring to appendicitis, but means she had pancreatitis in the past. Some distension, decreased BS, no masses, hernias, or organomegaly, prior hysterectomy MS: all 4 extremities are symmetrical with no cyanosis, clubbing, or edema. Skin:  warm and dry with no masses, lesions, or rashes Neuro: Cranial nerves 2-12 grossly intact, sensation is normal throughout Psych: A&Ox3 with an appropriate affect.   Results for orders placed or performed during the hospital encounter of 09/15/20 (from the past 48 hour(s))  Resp Panel by RT-PCR (Flu A&B, Covid) Nasopharyngeal Swab     Status: Abnormal   Collection Time: 09/15/20  3:33 AM   Specimen: Nasopharyngeal Swab; Nasopharyngeal(NP) swabs in vial transport medium  Result Value Ref Range   SARS Coronavirus 2 by RT PCR POSITIVE (A) NEGATIVE    Comment: RESULT CALLED TO, READ BACK BY AND VERIFIED WITH: Heloise PurpuraJ FERRAINOLO RN 09/15/20 0541 JDW (NOTE) SARS-CoV-2 target nucleic acids are DETECTED.  The SARS-CoV-2 RNA is generally detectable in upper respiratory specimens during the acute phase of infection. Positive results are indicative of the presence of the identified virus, but do not rule out bacterial infection or co-infection with other pathogens not detected by the test. Clinical correlation with patient history and other diagnostic information is necessary to determine patient infection status. The expected result is Negative.  Fact Sheet for Patients: BloggerCourse.comhttps://www.fda.gov/media/152166/download  Fact Sheet for Healthcare Providers: SeriousBroker.ithttps://www.fda.gov/media/152162/download  This test is not yet approved or cleared by the [place] FDA and  has been authorized for detection and/or diagnosis of SARS-CoV-2 by FDA under an Emergency Use Authorization (EUA).  This EUA will remain in effect (meaning this test can be  used) for the duration of  the COVID-19 declaration under Section 564(b)(1) of the Act, 21 U.S.C. section 360bbb-3(b)(1), unless the authorization is terminated or revoked sooner.     Influenza A by PCR NEGATIVE NEGATIVE   Influenza B by PCR NEGATIVE NEGATIVE    Comment: (NOTE) The Xpert Xpress SARS-CoV-2/FLU/RSV plus assay is intended as an aid in the diagnosis  of influenza from Nasopharyngeal swab specimens and should not be used as a sole basis for treatment. Nasal washings and aspirates are unacceptable for Xpert Xpress SARS-CoV-2/FLU/RSV testing.  Fact Sheet for Patients: BloggerCourse.comhttps://www.fda.gov/media/152166/download  Fact Sheet for Healthcare Providers: SeriousBroker.ithttps://www.fda.gov/media/152162/download  This test is not yet approved or cleared by the [place] FDA and has been authorized for detection and/or diagnosis of SARS-CoV-2 by FDA under an Emergency Use Authorization (EUA). This EUA will remain in effect (meaning this test can be used) for the duration of the COVID-19 declaration under Section 564(b)(1) of the Act, 21 U.S.C. section 360bbb-3(b)(1), unless the authorization is terminated or revoked.  Performed at [facility], [address] [place], [place] [address]   Lipase, blood     Status: None   Collection Time: 09/15/20  3:36 AM  Result Value Ref Range   Lipase 28 11 - 51 U/L    Comment: Performed at [facility], [address] [place], [place] [address]  Comprehensive metabolic panel     Status: Abnormal   Collection Time: 09/15/20  3:36 AM  Result Value Ref Range   Sodium 140 135 - 145  mmol/L   Potassium 2.8 (L) 3.5 - 5.1 mmol/L   Chloride 100 98 - 111 mmol/L   CO2 25 22 - 32 mmol/L   Glucose, Bld 182 (H) 70 - 99 mg/dL    Comment: Glucose reference range applies only to samples taken after fasting for at least 8 hours.   BUN 14 6 - 20 mg/dL   Creatinine, Ser 5.27 0.44 - 1.00 mg/dL   Calcium 78.2 (H) 8.9 - 10.3 mg/dL   Total Protein 7.9 6.5 - 8.1 g/dL   Albumin 4.0 3.5 - 5.0 g/dL   AST 27 15 - 41 U/L   ALT 27 0 - 44 U/L   Alkaline Phosphatase 49 38 - 126 U/L   Total Bilirubin 0.3 0.3 - 1.2 mg/dL   GFR, Estimated >42 >35 mL/min    Comment: (NOTE) Calculated using the CKD-EPI Creatinine Equation (2021)    Anion gap 15 5 - 15    Comment: Performed at [facility], [address]  [place], [place] [address]  CBC     Status: Abnormal   Collection Time: 09/15/20  3:36 AM  Result Value Ref Range   WBC 11.8 (H) 4.0 - 10.5 K/uL   RBC 3.92 3.87 - 5.11 MIL/uL   Hemoglobin 12.7 12.0 - 15.0 g/dL   HCT 31.5 40.0 - 86.7 %   MCV 94.4 80.0 - 100.0 fL   MCH 32.4 26.0 - 34.0 pg   MCHC 34.3 30.0 - 36.0 g/dL   RDW 61.9 50.9 - 32.6 %   Platelets 289 150 - 400 K/uL   nRBC 0.0 0.0 - 0.2 %    Comment: Performed at [facility], [address], [place], [place] [address]  I-Stat beta hCG blood, ED     Status: None   Collection Time: 09/15/20  4:04 AM  Result Value Ref Range   I-stat hCG, quantitative <5.0 <5 mIU/mL   Comment 3            Comment:   GEST. AGE      CONC.  (mIU/mL)   <=1 WEEK        5 - 50     2 WEEKS       50 - 500     3 WEEKS       100 - 10,000     4 WEEKS     1,000 - 30,000        FEMALE AND NON-PREGNANT FEMALE:     LESS THAN 5 mIU/mL    CT ABDOMEN PELVIS W CONTRAST  Result Date: 09/15/2020 CLINICAL DATA:  Right lower quadrant abdominal pain. EXAM: CT ABDOMEN AND PELVIS WITH CONTRAST TECHNIQUE: Multidetector CT imaging of the abdomen and pelvis was performed using the standard protocol following bolus administration of intravenous contrast. CONTRAST:  OMNIPAQUE IOHEXOL 300 MG/ML  SOLN COMPARISON:  Jan 17, 2014. FINDINGS: Lower chest: No acute abnormality. Hepatobiliary: No gallstones or biliary dilatation is noted. Stable 3 cm rounded abnormality seen in the left hepatic lobe with peripheral nodular enhancement most consistent with hemangioma. Pancreas: Unremarkable. No pancreatic ductal dilatation or surrounding inflammatory changes. Spleen: Normal in size without focal abnormality. Adrenals/Urinary Tract: Adrenal glands are unremarkable. Kidneys are normal, without renal calculi, focal lesion, or hydronephrosis. Bladder is unremarkable. Stomach/Bowel: The stomach and appendix are unremarkable. Moderately dilated distal small bowel loops are noted with  transition zone seen in the pelvis involving the distal ileum best seen on image number 96 of series  6. Vascular/Lymphatic: No significant vascular findings are present. No enlarged abdominal or pelvic lymph nodes. Reproductive: Status post hysterectomy. No adnexal masses. Other: Mild amount of free fluid is noted in the pelvis. No definite hernia is noted. Musculoskeletal: No acute or significant osseous findings. IMPRESSION: 1. Moderately dilated distal small bowel loops are noted with transition zone seen in the pelvis involving the distal ileum. This is concerning for small bowel obstruction. 2. Mild amount of free fluid is noted in the pelvis. 3. Stable 3 cm rounded abnormality seen in left hepatic lobe with peripheral nodular enhancement most consistent with hemangioma. Electronically Signed   By: Lupita Raider M.D.   On: 09/15/2020 11:49      Assessment/Plan: COVID POSITIVE/asymptomatic Hypertension Hx tobacco use -occasional EtOH use -weekly or more. Hypokalemia  SBO Hx abdominal hysterectomy  FEN:NPO except ice chips/IV fluids ID:  None DVT:  Lovenox Follow up: TBD  Plan:  We had planned the small bowel protocol.  Currently patient is asymptomatic and is actually asking to eat.  She had a BM yesterday and 1 this AM.  We will plan to admit her to observation, bowel rest with IV fluids, I will give her some sips and chips tonight we will recheck her film in the AM.  She vomited x3 at home but has not vomited here in the hospital, and has been here since around 3 AM.  If she has further nausea and vomiting we will insert an NG tube.  We will asked Medicine to evaluate just to be sure we do not have to do anything else in regards to her COVID.  We will also replace her potassium.  Recheck labs in AM.      Sherrie George, [facility] 09/15/2020, 12:10 PM Please see Amion for pager number during day hours 7:00am-4:30pm

## 2020-09-16 ENCOUNTER — Other Ambulatory Visit (HOSPITAL_COMMUNITY): Payer: Self-pay | Admitting: Internal Medicine

## 2020-09-16 ENCOUNTER — Observation Stay (HOSPITAL_COMMUNITY): Payer: BLUE CROSS/BLUE SHIELD

## 2020-09-16 DIAGNOSIS — K56609 Unspecified intestinal obstruction, unspecified as to partial versus complete obstruction: Secondary | ICD-10-CM | POA: Diagnosis not present

## 2020-09-16 DIAGNOSIS — U071 COVID-19: Secondary | ICD-10-CM | POA: Diagnosis not present

## 2020-09-16 LAB — BASIC METABOLIC PANEL
Anion gap: 10 (ref 5–15)
BUN: 9 mg/dL (ref 6–20)
CO2: 27 mmol/L (ref 22–32)
Calcium: 9.5 mg/dL (ref 8.9–10.3)
Chloride: 103 mmol/L (ref 98–111)
Creatinine, Ser: 0.66 mg/dL (ref 0.44–1.00)
GFR, Estimated: >60 mL/min (ref >60)
Glucose, Bld: 95 mg/dL (ref 70–99)
Potassium: 3.7 mmol/L (ref 3.5–5.1)
Sodium: 140 mmol/L (ref 135–145)

## 2020-09-16 LAB — URINALYSIS, ROUTINE W REFLEX MICROSCOPIC
Bilirubin Urine: NEGATIVE
Glucose, UA: NEGATIVE mg/dL
Ketones, ur: NEGATIVE mg/dL
Nitrite: NEGATIVE
Protein, ur: 30 mg/dL — AB
Specific Gravity, Urine: 1.013 (ref 1.005–1.030)
pH: 6 (ref 5.0–8.0)

## 2020-09-16 LAB — CBC
HCT: 34.9 % — ABNORMAL LOW (ref 36.0–46.0)
Hemoglobin: 11.4 g/dL — ABNORMAL LOW (ref 12.0–15.0)
MCH: 31.1 pg (ref 26.0–34.0)
MCHC: 32.7 g/dL (ref 30.0–36.0)
MCV: 95.4 fL (ref 80.0–100.0)
Platelets: 234 10*3/uL (ref 150–400)
RBC: 3.66 MIL/uL — ABNORMAL LOW (ref 3.87–5.11)
RDW: 11.9 % (ref 11.5–15.5)
WBC: 8.2 10*3/uL (ref 4.0–10.5)
nRBC: 0 % (ref 0.0–0.2)

## 2020-09-16 LAB — D-DIMER, QUANTITATIVE: D-Dimer, Quant: 2.71 ug/mL-FEU — ABNORMAL HIGH (ref 0.00–0.50)

## 2020-09-16 LAB — MAGNESIUM: Magnesium: 2 mg/dL (ref 1.7–2.4)

## 2020-09-16 LAB — FERRITIN: Ferritin: 30 ng/mL (ref 11–307)

## 2020-09-16 MED ORDER — ZINC SULFATE 220 (50 ZN) MG PO CAPS: 220.0000 mg | ORAL_CAPSULE | Freq: Every day | ORAL | 0 refills | Status: DC

## 2020-09-16 MED ORDER — MORPHINE SULFATE (PF) 2 MG/ML IV SOLN
1.0000 mg | INTRAVENOUS | Status: DC | PRN
  Administered 2020-09-16: 1 mg via INTRAVENOUS
  Filled 2020-09-16: qty 1

## 2020-09-16 MED ORDER — ASCORBIC ACID 500 MG PO TABS
500.0000 mg | ORAL_TABLET | Freq: Every day | ORAL | Status: DC
  Administered 2020-09-16: 500 mg via ORAL
  Filled 2020-09-16: qty 1

## 2020-09-16 MED ORDER — ZINC SULFATE 220 (50 ZN) MG PO CAPS
220.0000 mg | ORAL_CAPSULE | Freq: Every day | ORAL | Status: DC
  Administered 2020-09-16: 220 mg via ORAL
  Filled 2020-09-16: qty 1

## 2020-09-16 MED ORDER — TRIAMTERENE-HCTZ 37.5-25 MG PO TABS: 1.0000 | ORAL_TABLET | Freq: Every day | ORAL | 0 refills | Status: DC

## 2020-09-16 MED ORDER — ASCORBIC ACID 500 MG PO TABS: 500.0000 mg | ORAL_TABLET | Freq: Every day | ORAL | 0 refills | Status: DC

## 2020-09-16 MED FILL — ZINC SULFATE 220 MG TABLET: 220 (50 ZN) | 30 days supply | Qty: 30 | Fill #0

## 2020-09-16 MED FILL — VITAMIN C 500 MG TABLET: 500 | 30 days supply | Qty: 30 | Fill #0

## 2020-09-16 NOTE — Plan of Care (Signed)
Patient discharged to home via personal ride. Discharge instructions reviewed with the patient and denies question.Tolerating soft diet. Stool x3 this am. Denies pain.

## 2020-09-16 NOTE — Discharge Instructions (Signed)
Bowel Obstruction A bowel obstruction means that something is blocking the small or large bowel. The bowel is also called the intestine. It is the long tube that connects the stomach to the opening of the butt (anus). When something blocks the bowel, food and fluids cannot pass through like normal. This condition needs to be treated. Treatment depends on the cause of the problem and how bad the problem is. What are the causes? Common causes of this condition include:  Scar tissue (adhesions) from past surgery or from high-energy X-rays (radiation).  Recent surgery in the belly. This affects how food moves in the bowel.  Some diseases, such as: ? Irritation of the lining of the digestive tract (Crohn's disease). ? Irritation of small pouches in the bowel (diverticulitis).  Growths or tumors.  A bulging organ (hernia).  Twisting of the bowel (volvulus).  A foreign body.  Slipping of a part of the bowel into another part (intussusception).   What are the signs or symptoms? Symptoms of this condition include:  Pain in the belly.  Feeling sick to your stomach (nauseous).  Throwing up (vomiting).  Bloating in the belly.  Being unable to pass gas.  Trouble pooping (constipation).  Watery poop (diarrhea).  A lot of belching. How is this diagnosed? This condition may be diagnosed based on:  A physical exam.  Medical history.  Imaging tests, such as X-ray or CT scan.  Blood tests.  Urine tests. How is this treated? Treatment for this condition may include:  Fluids and pain medicines that are given through an IV tube. Your doctor may tell you not to eat or drink if you feel sick to your stomach and are throwing up.  Eating a clear liquid diet for a few days.  Putting a small tube (nasogastric tube) into the stomach. This will help with pain, discomfort, and nausea by removing blocked air and fluids from the stomach.  Surgery. This may be needed if other treatments do  not work. Follow these instructions at home: Medicines  Take over-the-counter and prescription medicines only as told by your doctor.  If you were prescribed an antibiotic medicine, take it as told by your doctor. Do not stop taking the antibiotic even if you start to feel better. General instructions  Follow your diet as told by your doctor. You may need to: ? Only drink clear liquids until you start to get better. ? Avoid solid foods.  Return to your normal activities as told by your doctor. Ask your doctor what activities are safe for you.  Do not sit for a long time without moving. Get up to take short walks every 1-2 hours. This is important. Ask for help if you feel weak or unsteady.  Keep all follow-up visits as told by your doctor. This is important. How is this prevented? After having a bowel obstruction, you may be more likely to have another. You can do some things to stop it from happening again.  If you have a long-term (chronic) disease, contact your doctor if you see changes or problems.  Take steps to prevent or treat trouble pooping. Your doctor may ask that you: ? Drink enough fluid to keep your pee (urine) pale yellow. ? Take over-the-counter or prescription medicines. ? Eat foods that are high in fiber. These include beans, whole grains, and fresh fruits and vegetables. ? Limit foods that are high in fat and sugar. These include fried or sweet foods.  Stay active. Ask your doctor which  exercises are safe for you.  Avoid stress.  Eat three small meals and three small snacks each day.  Work with a Psychologist, prison and probation servicesfood expert (dietitian) to make a meal plan that works for you.  Do not use any products that contain nicotine or tobacco, such as cigarettes and e-cigarettes. If you need help quitting, ask your doctor.   Contact a doctor if:  You have a fever.  You have chills. Get help right away if:  You have pain or cramps that get worse.  You throw up blood.  You are  sick to your stomach.  You cannot stop throwing up.  You cannot drink fluids.  You feel mixed up (confused).  You feel very thirsty (dehydrated).  Your belly gets more bloated.  You feel weak or you pass out (faint). Summary  A bowel obstruction means that something is blocking the small or large bowel.  Treatment may include IV fluids and pain medicine. You may also have a clear liquid diet, a small tube in your stomach, or surgery.  Drink clear liquids and avoid solid foods until you get better. This information is not intended to replace advice given to you by your health care provider. Make sure you discuss any questions you have with your health care provider. Document Revised: 01/02/2018 Document Reviewed: 01/02/2018 Elsevier Patient Education  2021 Elsevier Inc.  COVID-19 Quarantine vs. Isolation QUARANTINE keeps someone who was in close contact with someone who has COVID-19 away from others. Quarantine if you have been in close contact with someone who has COVID-19, unless you have been fully vaccinated. If you are fully vaccinated  You do NOT need to quarantine unless they have symptoms  Get tested 3-5 days after your exposure, even if you don't have symptoms  Wear a mask indoors in public for 14 days following exposure or until your test result is negative If you are not fully vaccinated  Stay home for 14 days after your last contact with a person who has COVID-19  Watch for fever (100.45F), cough, shortness of breath, or other symptoms of COVID-19  If possible, stay away from people you live with, especially people who are at higher risk for getting very sick from COVID-19  Contact your local public health department for options in your area to possibly shorten your quarantine ISOLATION keeps someone who is sick or tested positive for COVID-19 without symptoms away from others, even in their own home. People who are in isolation should stay home and stay in a  specific "sick room" or area and use a separate bathroom (if available). If you are sick and think or know you have COVID-19 Stay home until after  At least 10 days since symptoms first appeared and  At least 24 hours with no fever without the use of fever-reducing medications and  Symptoms have improved If you tested positive for COVID-19 but do not have symptoms  Stay home until after 10 days have passed since your positive viral test  If you develop symptoms after testing positive, follow the steps above for those who are sick [email or web address] 05/31/2020 This information is not intended to replace advice given to you by your health care provider. Make sure you discuss any questions you have with your health care provider. Document Revised: 07/05/2020 Document Reviewed: 07/05/2020 Elsevier Patient Education  2021 Elsevier Inc.  COVID-19: What to Do if You Are Sick If you have a fever, cough or other symptoms, you might have COVID-19. Most people have mild  illness and are able to recover at home. If you are sick:  Keep track of your symptoms.  If you have an emergency warning sign (including trouble breathing), call 911. Steps to help prevent the spread of COVID-19 if you are sick If you are sick with COVID-19 or think you might have COVID-19, follow the steps below to care for yourself and to help protect other people in your home and community. Stay home except to get medical care  Stay home. Most people with COVID-19 have mild illness and can recover at home without medical care. Do not leave your home, except to get medical care. Do not visit public areas.  Take care of yourself. Get rest and stay hydrated. Take over-the-counter medicines, such as acetaminophen, to help you feel better.  Stay in touch with your doctor. Call before you get medical care. Be sure to get care if you have trouble breathing, or have any other emergency warning signs, or if you think it is an  emergency.  Avoid public transportation, ride-sharing, or taxis. Separate yourself from other people As much as possible, stay in a specific room and away from other people and pets in your home. If possible, you should use a separate bathroom. If you need to be around other people or animals in or outside of the home, wear a mask. Tell your close contactsthat they may have been exposed to COVID-19. An infected person can spread COVID-19 starting 48 hours (or 2 days) before the person has any symptoms or tests positive. By letting your close contacts know they may have been exposed to COVID-19, you are helping to protect everyone.  Additional guidance is available for those living in close quarters and shared housing.  See COVID-19 and Animals if you have questions about pets.  If you are diagnosed with COVID-19, someone from the health department may call you. Answer the call to slow the spread. Monitor your symptoms  Symptoms of COVID-19 include fever, cough, or other symptoms.  Follow care instructions from your healthcare provider and local health department. Your local health authorities may give instructions on checking your symptoms and reporting information. When to seek emergency medical attention Look for emergency warning signs* for COVID-19. If someone is showing any of these signs, seek emergency medical care immediately:  Trouble breathing  Persistent pain or pressure in the chest  New confusion  Inability to wake or stay awake  Pale, gray, or blue-colored skin, lips, or nail beds, depending on skin tone *This list is not all possible symptoms. Please call your medical provider for any other symptoms that are severe or concerning to you. Call 911 or call ahead to your local emergency facility: Notify the operator that you are seeking care for someone who has or may have COVID-19. Call ahead before visiting your doctor  Call ahead. Many medical visits for routine care  are being postponed or done by phone or telemedicine.  If you have a medical appointment that cannot be postponed, call your doctor's office, and tell them you have or may have COVID-19. This will help the office protect themselves and other patients. Get  tested  If you have symptoms of COVID-19, get tested. While waiting for test results, you stay away from others, including staying apart from those living in your household.  You can visit your state, tribal, local, and territorialhealth department's website to look for the latest local information on testing sites. If you are sick, wear a mask over your  nose and mouth  You should wear a mask over your nose and mouth if you must be around other people or animals, including pets (even at home).  You don't need to wear the mask if you are alone. If you can't put on a mask (because of trouble breathing, for example), cover your coughs and sneezes in some other way. Try to stay at least 6 feet away from other people. This will help protect the people around you.  Masks should not be placed on young children under age 81 years, anyone who has trouble breathing, or anyone who is not able to remove the mask without help. Note: During the COVID-19 pandemic, medical grade facemasks are reserved for healthcare workers and some first responders. Cover your coughs and sneezes  Cover your mouth and nose with a tissue when you cough or sneeze.  Throw away used tissues in a lined trash can.  Immediately wash your hands with soap and water for at least 20 seconds. If soap and water are not available, clean your hands with an alcohol-based hand sanitizer that contains at least 60% alcohol. Clean your hands often  Wash your hands often with soap and water for at least 20 seconds. This is especially important after blowing your nose, coughing, or sneezing; going to the bathroom; and before eating or preparing food.  Use hand sanitizer if soap and water are  not available. Use an alcohol-based hand sanitizer with at least 60% alcohol, covering all surfaces of your hands and rubbing them together until they feel dry.  Soap and water are the best option, especially if hands are visibly dirty.  Avoid touching your eyes, nose, and mouth with unwashed hands.  Handwashing Tips Avoid sharing personal household items  Do not share dishes, drinking glasses, cups, eating utensils, towels, or bedding with other people in your home.  Wash these items thoroughly after using them with soap and water or put in the dishwasher. Clean all "high-touch" surfaces everyday  Clean and disinfect high-touch surfaces in your "sick room" and bathroom; wear disposable gloves. Let someone else clean and disinfect surfaces in common areas, but you should clean your bedroom and bathroom, if possible.  If a caregiver or other person needs to clean and disinfect a sick person's bedroom or bathroom, they should do so on an as-needed basis. The caregiver/other person should wear a mask and disposable gloves prior to cleaning. They should wait as long as possible after the person who is sick has used the bathroom before coming in to clean and use the bathroom. ? High-touch surfaces include phones, remote controls, counters, tabletops, doorknobs, bathroom fixtures, toilets, keyboards, tablets, and bedside tables.  Clean and disinfect areas that may have blood, stool, or body fluids on them.  Use household cleaners and disinfectants. Clean the area or item with soap and water or another detergent if it is dirty. Then, use a household disinfectant. ? Be sure to follow the instructions on the label to ensure safe and effective use of the product. Many products recommend keeping the surface wet for several minutes to ensure germs are killed. Many also recommend precautions such as wearing gloves and making sure you have good ventilation during use of the product. ? Use a product from  [employer] List N: Disinfectants for Coronavirus (COVID-19). ? Complete Disinfection Guidance When you can be around others after being sick with COVID-19 Deciding when you can be around others is different for different situations. Find out when you can  safely end home isolation. For any additional questions about your care, contact your healthcare provider or state or local health department. 11/19/2019 Content source: [facility] ([facility]), Division of Viral Diseases This information is not intended to replace advice given to you by your health care provider. Make sure you discuss any questions you have with your health care provider. Document Revised: 07/05/2020 Document Reviewed: 07/05/2020 Elsevier Patient Education  2021 [employer].

## 2020-09-16 NOTE — Discharge Summary (Signed)
    Patient ID: NAKAI YARD [id number] [birth year] 60 y.o.  Admit date: 09/15/2020 Discharge date: 09/16/2020  Admitting Diagnosis: SBO COVID-19  Discharge Diagnosis Patient Active Problem List   Diagnosis Date Noted  . SBO (small bowel obstruction) (HCC) 09/15/2020  . COVID-19 virus infection     Consultants hospitalist  Reason for Admission: Patient is a 60 year old female presents to the ED early this AM with RLQ pain after eating some bad pork, she also had diarrhea. She told them she tried to drink some beer to make it better, but did not.  She has had this problem before, but never this severe and it get better on it's own.  She had had multiple episodes of emesis and a BM last PM was normal.    Work up shows she is afebrile, VSS some mild tachycardia up to 106. Glucose 182, K+ 2.8, Calcium 10.5.  Lipase 28, WBC 11.8, H/H 12.7/37.0, platelets 289,000, i-STAT  HCG <5, Respiratory panel is positive for COVID.  CT abdomen shows Moderately dilated distal small bowel loops are noted with transition zone seen in the pelvis involving the distal ileum. This is concerning for small bowel obstruction.   Mild amount of free fluid is noted in the pelvis.  Stable 3 cm rounded abnormality seen in left hepatic lobe with peripheral nodular enhancement most consistent with hemangioma. Of note she has had 2 COVID vaccines shots. She still has some discomfort RLQ, but says she is better, + BM today and no current nausea or vomiting.   Procedures none  Hospital Course:  The patient was admitted, but by the time she was admitted she had already had a BM and was feeling better.  She was placedon CLD and her diet was able to be advanced as tolerates to a soft diet.  No further N/V.  She was noted to be an incidental asymptomatic COVID +.  The medical team was consulted, but no intervention warranted as she remained asymptomatic.  She was stable on HD 1 for DC home.  Physical Exam: Heart:  regular Lungs: CTAB Abd: soft, minimally tender as expected, +BS, ND  Allergies as of 09/16/2020   No Known Allergies     Medication List    TAKE these medications   cetirizine 10 MG tablet Commonly known as: ZYRTEC Take 10 mg by mouth daily as needed for allergies.   triamterene-hydrochlorothiazide 37.5-25 MG tablet Commonly known as: MAXZIDE-25 Take 1 tablet by mouth daily.         Follow-up Information    Blount, Viviana Simpler, MD Follow up.   Specialty: Family Medicine Why: as needed Contact information: [address] [address] [place] [address] [phone]               Signed: Barnetta Chapel, [facility] Surgery 09/16/2020, 8:51 AM Please see Amion for pager number during day hours 7:00am-4:30pm, 7-11:30am on Weekends

## 2020-09-17 NOTE — Progress Notes (Signed)
[facility] PROGRESS NOTE  Patient: Laura Vega GYK:[id number]   PCP: Burtis Junes, MD (Inactive) DOB: [birth year]   DOA: 09/15/2020   DOS: 09/16/2020    Subjective: pt ready for discharge, no acute complain, no shortness of breath no cough no fever or chest pain  Objective:  Vitals:   09/16/20 0200 09/16/20 0634  BP: (!) 150/96 111/76  Pulse: 76 68  Resp: 18 17  Temp: 97.7 F (36.5 C) 100 F (37.8 C)  SpO2: 98% 97%    Assessment and plan: covid 19 infection  Stable  No need for treatment. Added vitamins   HTN Pt wanted to fill prescription as she will run out in 4 days.  Sent to [facility] pharmacy to facilitate quarantine.   Author: Lynden Oxford, MD [facility] 09/16/2020    If 7PM-7AM, please contact night-coverage at [email or web address]

## 2020-10-07 ENCOUNTER — Encounter (HOSPITAL_COMMUNITY): Payer: Self-pay

## 2020-10-07 ENCOUNTER — Ambulatory Visit (HOSPITAL_COMMUNITY)
Admission: EM | Admit: 2020-10-07 | Discharge: 2020-10-07 | Disposition: A | Discharge status: Home / Self Care | Payer: BLUE CROSS/BLUE SHIELD | Attending: Family Medicine | Admitting: Family Medicine

## 2020-10-07 DIAGNOSIS — M25571 Pain in right ankle and joints of right foot: Secondary | ICD-10-CM

## 2020-10-07 DIAGNOSIS — M25471 Effusion, right ankle: Secondary | ICD-10-CM

## 2020-10-07 MED ORDER — PREDNISONE 20 MG PO TABS: 40.0000 mg | ORAL_TABLET | Freq: Every day | ORAL | 0 refills | Status: DC

## 2020-10-07 MED ORDER — CYCLOBENZAPRINE HCL 5 MG PO TABS: 5.0000 mg | ORAL_TABLET | Freq: Three times a day (TID) | ORAL | 0 refills | Status: DC | PRN

## 2020-10-07 NOTE — ED Triage Notes (Signed)
Pt presents with complaints of left ankle pain from wearing the wrong insoles. Reports she has been doing epsom salt and ibuprofen with no relief. Denies any injury during intake.

## 2020-10-07 NOTE — Discharge Instructions (Signed)
Compression stocking, epsom salt soaks, elevate leg at rest

## 2020-10-11 NOTE — ED Provider Notes (Signed)
[facility]    CSN: [id number] Arrival date & time: 10/07/20  1604      History   Chief Complaint Chief Complaint  Patient presents with  . Ankle Pain    HPI Laura Vega is a 60 y.o. female.   Here today with right ankle pain and mild swelling the past few days or so that she thinks is coming from not having the right shoes/insoles lately she needs for work. Stands a lot during work and feels this exacerbates her sxs. Denies known injury, redness, fever, chills, skin lesion, significant swelling. Has not been trying anything OTC for sxs thus far.      Past Medical History:  Diagnosis Date  . Allergies   . Hypertension     Patient Active Problem List   Diagnosis Date Noted  . SBO (small bowel obstruction) (HCC) 09/15/2020  . COVID-19 virus infection     Past Surgical History:  Procedure Laterality Date  . ABDOMINAL HYSTERECTOMY      OB History   No obstetric history on file.      Home Medications    Prior to Admission medications   Medication Sig Start Date End Date Taking? Authorizing Provider  cyclobenzaprine (FLEXERIL) 5 MG tablet Take 1 tablet (5 mg total) by mouth 3 (three) times daily as needed for muscle spasms. DO NOT DRINK ALCOHOL OR DRIVE WHILE TAKING THIS MEDICATION 10/07/20  Yes Particia Nearing, PA-C  predniSONE (DELTASONE) 20 MG tablet Take 2 tablets (40 mg total) by mouth daily with breakfast. 10/07/20  Yes Particia Nearing, PA-C  ascorbic acid (VITAMIN C) 500 MG tablet Take 1 tablet (500 mg total) by mouth daily. 09/16/20   Rolly Salter, MD  cetirizine (ZYRTEC) 10 MG tablet Take 10 mg by mouth daily as needed for allergies.    [provider]  triamterene-hydrochlorothiazide (MAXZIDE-25) 37.5-25 MG tablet Take 1 tablet by mouth daily. 09/16/20   Rolly Salter, MD  zinc sulfate 220 (50 Zn) MG capsule Take 1 capsule (220 mg total) by mouth daily. 09/16/20   Rolly Salter, MD    Family History Family History   Problem Relation Age of Onset  . Cancer Mother   . Kidney disease Mother   . Cancer Sister     Social History Social History   Tobacco Use  . Smoking status: Current Some Day Smoker  . Smokeless tobacco: Never Used  Vaping Use  . Vaping Use: Never used  Substance Use Topics  . Alcohol use: Yes    Alcohol/week: 16.0 standard drinks    Types: 16 Cans of beer per week  . Drug use: No     Allergies   Patient has no known allergies.   Review of Systems Review of Systems PER HPI   Physical Exam Triage Vital Signs ED Triage Vitals  Enc Vitals Group     BP 10/07/20 1704 (!) 104/57     Pulse Rate 10/07/20 1704 88     Resp 10/07/20 1704 19     Temp 10/07/20 1704 98 F (36.7 C)     Temp src --      SpO2 10/07/20 1704 100 %     Weight --      Height --      Head Circumference --      Peak Flow --      Pain Score 10/07/20 1703 6     Pain Loc --      Pain Edu? --  Excl. in GC? --    No data found.  Updated Vital Signs BP (!) 104/57   Pulse 88   Temp 98 F (36.7 C)   Resp 19   SpO2 100%   Visual Acuity Right Eye Distance:   Left Eye Distance:   Bilateral Distance:    Right Eye Near:   Left Eye Near:    Bilateral Near:     Physical Exam Vitals and nursing note reviewed.  Constitutional:      Appearance: Normal appearance. She is not ill-appearing.  HENT:     Head: Atraumatic.  Eyes:     Extraocular Movements: Extraocular movements intact.     Conjunctiva/sclera: Conjunctivae normal.  Cardiovascular:     Rate and Rhythm: Normal rate and regular rhythm.     Pulses: Normal pulses.     Heart sounds: Normal heart sounds.  Pulmonary:     Effort: Pulmonary effort is normal.     Breath sounds: Normal breath sounds.  Musculoskeletal:        General: Swelling (trace edema right ankle) and tenderness (minimal ttp right ankle diffusely) present. No deformity. Normal range of motion.     Cervical back: Normal range of motion and neck supple.  Skin:     General: Skin is warm and dry.     Findings: No bruising, erythema or lesion.  Neurological:     Mental Status: She is alert and oriented to person, place, and time.     Sensory: No sensory deficit.  Psychiatric:        Mood and Affect: Mood normal.        Thought Content: Thought content normal.        Judgment: Judgment normal.      UC Treatments / Results  Labs (all labs ordered are listed, but only abnormal results are displayed) Labs Reviewed - No data to display  EKG   Radiology No results found.  Procedures Procedures (including critical care time)  Medications Ordered in UC Medications - No data to display  Initial Impression / Assessment and Plan / UC Course  I have reviewed the triage vital signs and the nursing notes.  Pertinent labs & imaging results that were available during my care of the patient were reviewed by me and considered in my medical decision making (see chart for details).     Minimal edema, painful only when standing long periods. Compression stockings, new supportive shoes, epsom salt soaks, OTC pain relievers. Prednisone burst and flexeril given to help reduce current flare. F/u with Sports Medicine if not resolving.   Final Clinical Impressions(s) / UC Diagnoses   Final diagnoses:  Acute right ankle pain  Right ankle swelling     Discharge Instructions     Compression stocking, epsom salt soaks, elevate leg at rest    ED Prescriptions    Medication Sig Dispense Auth. Provider   predniSONE (DELTASONE) 20 MG tablet Take 2 tablets (40 mg total) by mouth daily with breakfast. 10 tablet Particia Nearing, PA-C   cyclobenzaprine (FLEXERIL) 5 MG tablet Take 1 tablet (5 mg total) by mouth 3 (three) times daily as needed for muscle spasms. DO NOT DRINK ALCOHOL OR DRIVE WHILE TAKING THIS MEDICATION 15 tablet Particia Nearing, [place]     PDMP not reviewed this encounter.   Roosvelt Maser [place], [place] 10/11/20 [phone]

## 2021-06-23 ENCOUNTER — Other Ambulatory Visit: Payer: Self-pay | Admitting: Student in an Organized Health Care Education/Training Program

## 2021-06-23 DIAGNOSIS — Z1231 Encounter for screening mammogram for malignant neoplasm of breast: Secondary | ICD-10-CM

## 2021-08-17 ENCOUNTER — Ambulatory Visit: Payer: BLUE CROSS/BLUE SHIELD

## 2021-08-18 ENCOUNTER — Ambulatory Visit: Payer: BLUE CROSS/BLUE SHIELD

## 2021-09-13 ENCOUNTER — Ambulatory Visit: Payer: Self-pay

## 2021-11-06 ENCOUNTER — Encounter (HOSPITAL_COMMUNITY): Payer: Self-pay | Admitting: *Deleted

## 2021-11-06 ENCOUNTER — Ambulatory Visit (HOSPITAL_COMMUNITY)
Admission: EM | Admit: 2021-11-06 | Discharge: 2021-11-06 | Disposition: A | Discharge status: Home / Self Care | Payer: 59 | Attending: Emergency Medicine | Admitting: Emergency Medicine

## 2021-11-06 ENCOUNTER — Other Ambulatory Visit: Payer: Self-pay

## 2021-11-06 DIAGNOSIS — J020 Streptococcal pharyngitis: Secondary | ICD-10-CM | POA: Insufficient documentation

## 2021-11-06 LAB — POCT RAPID STREP A, ED / UC: Streptococcus, Group A Screen (Direct): NEGATIVE

## 2021-11-06 MED ORDER — LIDOCAINE VISCOUS HCL 2 % MT SOLN: 15.0000 mL | OROMUCOSAL | 0 refills | Status: DC | PRN

## 2021-11-06 NOTE — ED Triage Notes (Signed)
T reports sore throat for one week. ?

## 2021-11-06 NOTE — Discharge Instructions (Signed)
Your rapid strep test today was negative, your throat sample has been sent to the lab to see if it will grow bacteria, if this occurs you will be notified and antibiotic be sent in for use ? ?In the meantime we must treat this as a viral symptom and manage the symptoms ? ?You may gargle and spit lidocaine solution every 4 hours as needed for temporary relief of your sore throat ? ?May attempt salt water gargles, throat lozenges, warm liquids and teaspoons of honey to help with your throat ? ? ?For the side of your neck, that is a swollen lymph node, you may massage the area, apply heat 15-minute intervals and use the above and below treatment for further management ? ?May use ibuprofen 600-800 every 6-8 hours every 8 hours as needed in addition to Tylenol every 6 hours for additional comfort ? ?You may follow-up at urgent care as needed  ?

## 2021-11-06 NOTE — ED Provider Notes (Signed)
?[facility] ? ? ? ?CSN: [id number] ?Arrival date & time: 11/06/21  1424 ? ? ?  ? ?History   ?Chief Complaint ?Chief Complaint  ?Patient presents with  ? Sore Throat  ? ? ?HPI ?Laura Vega is a 61 y.o. female.  ? ?Patient presents with sore throat and swelling to the right side of neck for 1 week.  No known sick contacts.  Tolerating food and liquids.  Has attempted use of lemon and honey as well as salt water gargles which has been somewhat helpful.  Denies fever, chills, body aches, nasal congestion, coughing, shortness of breath, wheezing, ear pain or headaches. ? ?Past Medical History:  ?Diagnosis Date  ? Allergies   ? Hypertension   ? ? ?Patient Active Problem List  ? Diagnosis Date Noted  ? SBO (small bowel obstruction) (HCC) 09/15/2020  ? COVID-19 virus infection   ? ? ?Past Surgical History:  ?Procedure Laterality Date  ? ABDOMINAL HYSTERECTOMY    ? ? ?OB History   ?No obstetric history on file. ?  ? ? ? ?Home Medications   ? ?Prior to Admission medications   ?Medication Sig Start Date End Date Taking? Authorizing Provider  ?ascorbic acid (VITAMIN C) 500 MG tablet Take 1 tablet (500 mg total) by mouth daily. 09/16/20   Rolly Salter, MD  ?cetirizine (ZYRTEC) 10 MG tablet Take 10 mg by mouth daily as needed for allergies.    [provider]  ?cyclobenzaprine (FLEXERIL) 5 MG tablet Take 1 tablet (5 mg total) by mouth 3 (three) times daily as needed for muscle spasms. DO NOT DRINK ALCOHOL OR DRIVE WHILE TAKING THIS MEDICATION 10/07/20   Particia Nearing, PA-C  ?predniSONE (DELTASONE) 20 MG tablet Take 2 tablets (40 mg total) by mouth daily with breakfast. 10/07/20   Particia Nearing, PA-C  ?triamterene-hydrochlorothiazide (MAXZIDE-25) 37.5-25 MG tablet TAKE 1 TABLET BY MOUTH DAILY. 09/16/20 09/16/21  Rolly Salter, MD  ?zinc sulfate 220 (50 Zn) MG capsule Take 1 capsule (220 mg total) by mouth daily. 09/16/20   Rolly Salter, MD  ? ? ?Family History ?Family History  ?Problem  Relation Age of Onset  ? Cancer Mother   ? Kidney disease Mother   ? Cancer Sister   ? ? ?Social History ?Social History  ? ?Tobacco Use  ? Smoking status: Some Days  ? Smokeless tobacco: Never  ?Vaping Use  ? Vaping Use: Never used  ?Substance Use Topics  ? Alcohol use: Yes  ?  Alcohol/week: 16.0 standard drinks  ?  Types: 16 Cans of beer per week  ? Drug use: No  ? ? ? ?Allergies   ?Patient has no known allergies. ? ? ?Review of Systems ?Review of Systems  ?Constitutional: Negative.   ?HENT:  Positive for sore throat. Negative for congestion, dental problem, drooling, ear discharge, ear pain, facial swelling, hearing loss, mouth sores, nosebleeds, postnasal drip, rhinorrhea, sinus pressure, sinus pain, sneezing, tinnitus, trouble swallowing and voice change.   ?Respiratory: Negative.    ?Cardiovascular: Negative.   ?Gastrointestinal: Negative.   ? ? ?Physical Exam ?Triage Vital Signs ?ED Triage Vitals  ?Enc Vitals Group  ?   BP 11/06/21 1450 126/85  ?   Pulse Rate 11/06/21 1450 95  ?   Resp 11/06/21 1450 18  ?   Temp 11/06/21 1450 98.4 ?F (36.9 ?C)  ?   Temp src --   ?   SpO2 11/06/21 1450 98 %  ?   Weight --   ?  Height --   ?   Head Circumference --   ?   Peak Flow --   ?   Pain Score 11/06/21 1448 4  ?   Pain Loc --   ?   Pain Edu? --   ?   Excl. in GC? --   ? ?No data found. ? ?Updated Vital Signs ?BP 126/85   Pulse 95   Temp 98.4 ?F (36.9 ?C)   Resp 18   SpO2 98%  ? ?Visual Acuity ?Right Eye Distance:   ?Left Eye Distance:   ?Bilateral Distance:   ? ?Right Eye Near:   ?Left Eye Near:    ?Bilateral Near:    ? ?Physical Exam ?Constitutional:   ?   Appearance: She is well-developed.  ?HENT:  ?   Head: Normocephalic.  ?   Right Ear: Tympanic membrane and ear canal normal.  ?   Left Ear: Tympanic membrane and ear canal normal.  ?   Nose: No congestion.  ?   Mouth/Throat:  ?   Mouth: Mucous membranes are moist.  ?   Pharynx: Posterior oropharyngeal erythema present.  ?   Tonsils: No tonsillar exudate. 0 on the  right. 0 on the left.  ?Cardiovascular:  ?   Rate and Rhythm: Normal rate and regular rhythm.  ?   Heart sounds: Normal heart sounds.  ?Pulmonary:  ?   Effort: Pulmonary effort is normal.  ?   Breath sounds: Normal breath sounds.  ?Musculoskeletal:  ?   Cervical back: Normal range of motion.  ?Lymphadenopathy:  ?   Cervical: Cervical adenopathy present.  ?Skin: ?   General: Skin is warm and dry.  ?Neurological:  ?   General: No focal deficit present.  ?   Mental Status: She is alert and oriented to person, place, and time.  ?Psychiatric:     ?   Mood and Affect: Mood normal.     ?   Behavior: Behavior normal.  ? ? ? ?UC Treatments / Results  ?Labs ?(all labs ordered are listed, but only abnormal results are displayed) ?Labs Reviewed  ?POCT RAPID STREP A, ED / UC  ? ? ?EKG ? ? ?Radiology ?No results found. ? ?Procedures ?Procedures (including critical care time) ? ?Medications Ordered in UC ?Medications - No data to display ? ?Initial Impression / Assessment and Plan / UC Course  ?I have reviewed the triage vital signs and the nursing notes. ? ?Pertinent labs & imaging results that were available during my care of the patient were reviewed by me and considered in my medical decision making (see chart for details). ? ?Viral pharyngitis ? ?Vital signs are stable, patient has no signs of distress, mild erythema noted to the oropharynx with right-sided cervical adenopathy, discussed findings with patient, rapid strep negative, sent for culture, lidocaine viscous sent to pharmacy for pain management, advised continued use of ibuprofen and Tylenol, recommended massage, heat in 15-minute intervals for additional support to the cervical lymph node, recommended salt water gargles, throat lozenges, warm teas and honey for support due to throat, may follow-up with urgent care as needed, work note given ?Final Clinical Impressions(s) / UC Diagnoses  ? ?Final diagnoses:  ?None  ? ?Discharge Instructions   ?None ?  ? ?ED  Prescriptions   ?None ?  ? ?PDMP not reviewed this encounter. ?  ?Valinda Hoar, NP ?11/06/21 1521 ? ?

## 2021-11-09 LAB — CULTURE, GROUP A STREP (THRC)

## 2022-01-03 ENCOUNTER — Encounter: Payer: Self-pay | Admitting: Nurse Practitioner

## 2022-09-29 IMAGING — DX DG ABDOMEN 1V
1 series · 1 of 1 positions shown · non-contrast
Comparison: CT abdomen pelvis 09/15/2020

CLINICAL DATA: Small-bowel struc[REDACTED], no complaints today

EXAM:
ABDOMEN - 1 VIEW

[abdomen kub]
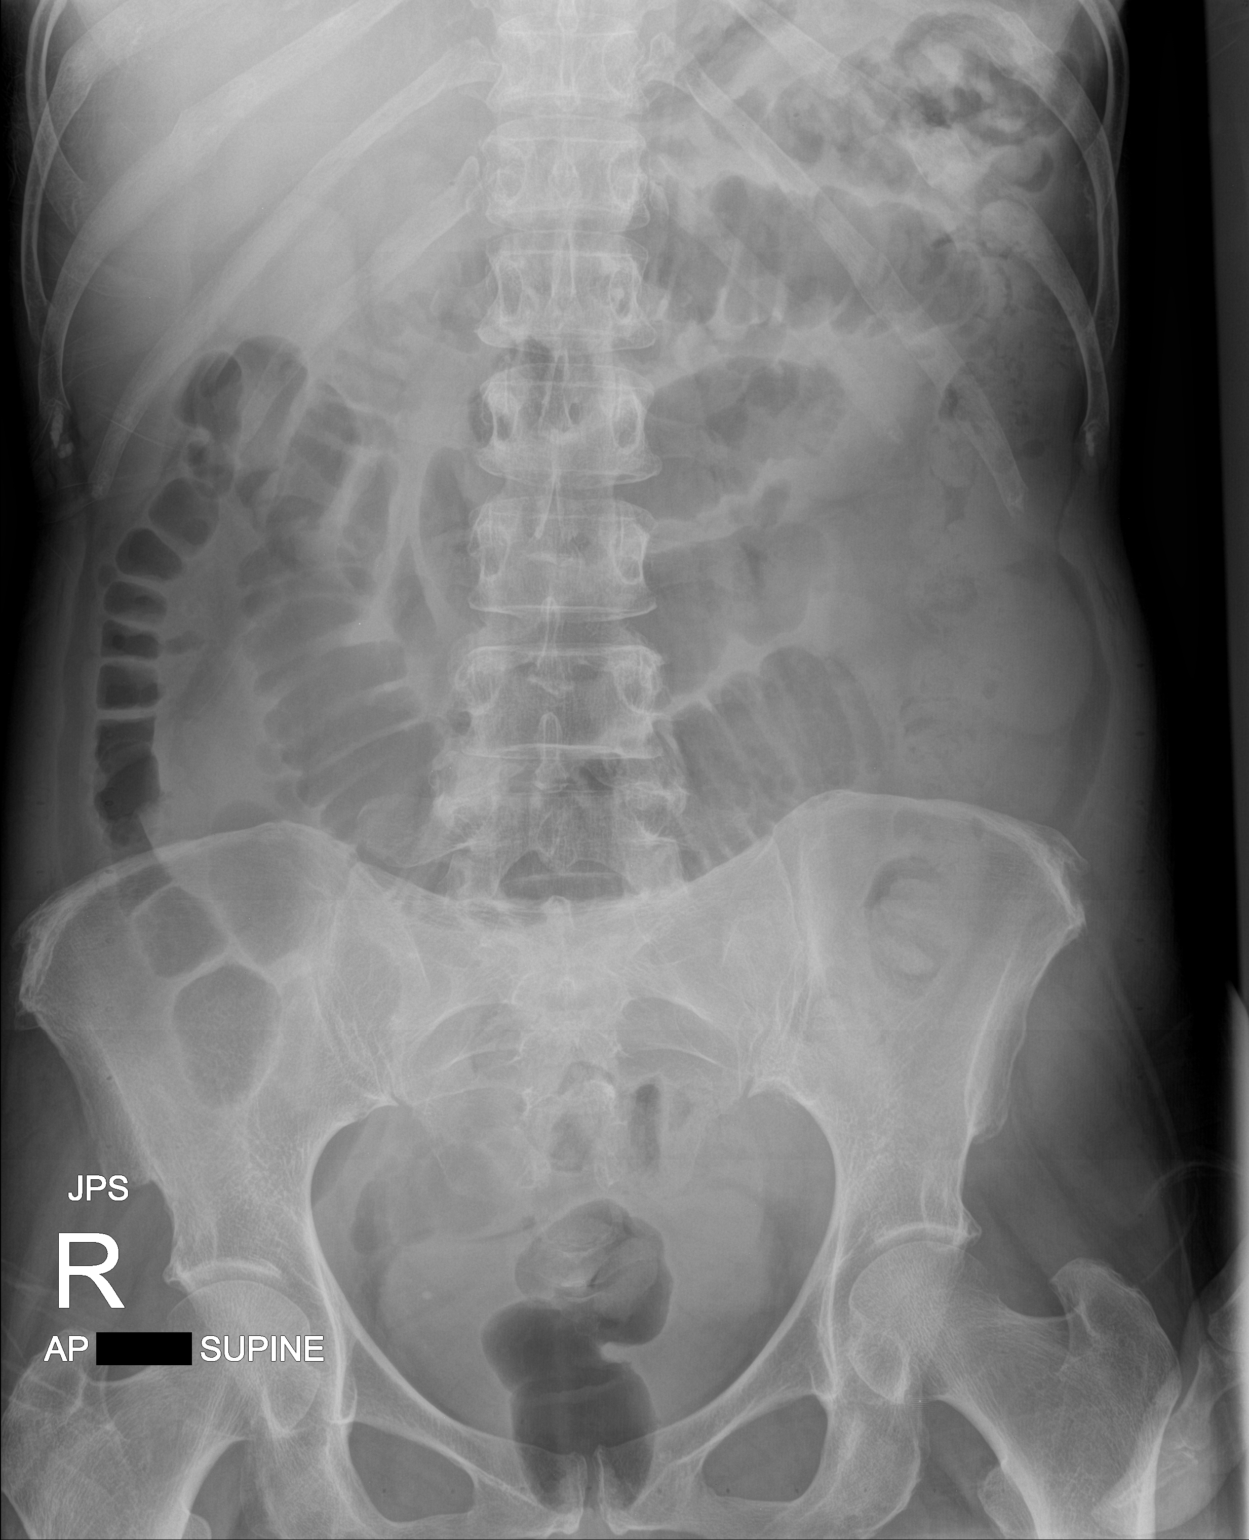

[1 of 1 positions shown; findings below may reference images not displayed]

FINDINGS: Persist air distention of numerous contiguous, clustered small bowel
loops within the mid abdomen. Incomplete decompression of the distal
colon. No suspicious abdominal calcifications. Mild degenerative
changes in the spine, hips and pelvis. No acute osseous
abnormalities.
IMPRESSION: Persisting air distention of numerous contiguous, clustered small
bowel loops within the mid abdomen compatible with small-bowel
obstruction seen on comparison CT.

## 2023-01-17 ENCOUNTER — Other Ambulatory Visit: Payer: Self-pay | Admitting: Physician Assistant

## 2023-01-17 ENCOUNTER — Other Ambulatory Visit: Payer: Self-pay

## 2023-01-17 DIAGNOSIS — Z1231 Encounter for screening mammogram for malignant neoplasm of breast: Secondary | ICD-10-CM

## 2023-02-13 ENCOUNTER — Ambulatory Visit
Admission: RE | Admit: 2023-02-13 | Discharge: 2023-02-13 | Disposition: A | Discharge status: Home / Self Care | Payer: BLUE CROSS/BLUE SHIELD | Source: Ambulatory Visit | Attending: Physician Assistant | Admitting: Physician Assistant

## 2023-02-13 DIAGNOSIS — Z1231 Encounter for screening mammogram for malignant neoplasm of breast: Secondary | ICD-10-CM

## 2023-03-01 ENCOUNTER — Ambulatory Visit (INDEPENDENT_AMBULATORY_CARE_PROVIDER_SITE_OTHER): Payer: BLUE CROSS/BLUE SHIELD

## 2023-03-01 ENCOUNTER — Encounter (HOSPITAL_COMMUNITY): Payer: Self-pay

## 2023-03-01 ENCOUNTER — Ambulatory Visit (HOSPITAL_COMMUNITY)
Admission: EM | Admit: 2023-03-01 | Discharge: 2023-03-01 | Disposition: A | Discharge status: Home / Self Care | Payer: BLUE CROSS/BLUE SHIELD | Attending: Internal Medicine | Admitting: Internal Medicine

## 2023-03-01 DIAGNOSIS — M25462 Effusion, left knee: Secondary | ICD-10-CM

## 2023-03-01 MED ORDER — NAPROXEN SODIUM 550 MG PO TABS: 550.0000 mg | ORAL_TABLET | Freq: Two times a day (BID) | ORAL | 0 refills | Status: DC

## 2023-03-01 NOTE — ED Provider Notes (Signed)
[facility]    CSN: [id number] Arrival date & time: 03/01/23  1345      History   Chief Complaint Chief Complaint  Patient presents with   Knee Pain    HPI Laura Vega is a 62 y.o. female who presents L knee pain and swelling since last week. She denies known fall or injury. States she works 2 jobs on her feet. Has been taking Ibuprofen with minimal  relief. She was not able to flex her knee all the way due to swelling and pain, and is able to much better now. Has hx of L shoulder OA.  She has hx of gout, but has bee mindful with her diet. She ate pork chops 4 days ago,.    Past Medical History:  Diagnosis Date   Allergies    Hypertension     Patient Active Problem List   Diagnosis Date Noted   SBO (small bowel obstruction) (HCC) 09/15/2020   COVID-19 virus infection     Past Surgical History:  Procedure Laterality Date   ABDOMINAL HYSTERECTOMY      OB History   No obstetric history on file.      Home Medications    Prior to Admission medications   Medication Sig Start Date End Date Taking? Authorizing Provider  naproxen sodium (ANAPROX DS) 550 MG tablet Take 1 tablet (550 mg total) by mouth 2 (two) times daily with a meal. 03/01/23  Yes Rodriguez-Southworth, Nettie Elm, PA-C    Family History Family History  Problem Relation Age of Onset   Cancer Mother    Kidney disease Mother    Cancer Sister     Social History Social History   Tobacco Use   Smoking status: Some Days   Smokeless tobacco: Never  Vaping Use   Vaping Use: Never used  Substance Use Topics   Alcohol use: Yes    Alcohol/week: 16.0 standard drinks of alcohol    Types: 16 Cans of beer per week   Drug use: No     Allergies   Patient has no known allergies.   Review of Systems Review of Systems  As noted in HPI   Physical Exam Triage Vital Signs ED Triage Vitals  Enc Vitals Group     BP 03/01/23 1430 (!) 154/83     Pulse Rate 03/01/23 1430 86     Resp  03/01/23 1430 18     Temp 03/01/23 1430 97.7 F (36.5 C)     Temp Source 03/01/23 1430 Oral     SpO2 03/01/23 1430 97 %     Weight 03/01/23 1430 147 lb (66.7 kg)     Height 03/01/23 1430 5\' 6"  (1.676 m)     Head Circumference --      Peak Flow --      Pain Score 03/01/23 1427 4     Pain Loc --      Pain Edu? --      Excl. in GC? --    No data found.  Updated Vital Signs BP (!) 154/83 (BP Location: Left Arm)   Pulse 86   Temp 97.7 F (36.5 C) (Oral)   Resp 18   Ht 5\' 6"  (1.676 m)   Wt 147 lb (66.7 kg)   SpO2 97%   BMI 23.73 kg/m   Visual Acuity Right Eye Distance:   Left Eye Distance:   Bilateral Distance:    Right Eye Near:   Left Eye Near:    Bilateral Near:  Physical Exam Vitals and nursing note reviewed.  Constitutional:      General: She is not in acute distress.    Appearance: She is normal weight. She is not toxic-appearing.  Eyes:     General: No scleral icterus.    Conjunctiva/sclera: Conjunctivae normal.  Pulmonary:     Effort: Pulmonary effort is normal.  Musculoskeletal:     Cervical back: Neck supple.     Comments: L KNEE- with moderate effusion above and below patella. Has medial knee joint tenderness. ROM is normal.   Skin:    General: Skin is warm and dry.     Findings: No bruising, erythema or rash.  Neurological:     Mental Status: She is alert and oriented to person, place, and time.     Gait: Gait normal.  Psychiatric:        Mood and Affect: Mood normal.        Behavior: Behavior normal.        Thought Content: Thought content normal.        Judgment: Judgment normal.      UC Treatments / Results  Labs (all labs ordered are listed, but only abnormal results are displayed) Labs Reviewed - No data to display  EKG   Radiology DG Knee Complete 4 Views Left  Result Date: 03/01/2023 CLINICAL DATA:  Acute left knee pain and swelling without known injury. EXAM: LEFT KNEE - COMPLETE 4+ VIEW COMPARISON:  None Available.  FINDINGS: No evidence of fracture or dislocation. Small suprapatellar joint effusion is noted. No evidence of arthropathy or other focal bone abnormality. Soft tissues are unremarkable. IMPRESSION: Small suprapatellar joint effusion.  No fracture or dislocation. Electronically Signed   By: Lupita Raider M.D.   On: 03/01/2023 15:18    Procedures Procedures (including critical care time)  Medications Ordered in UC Medications - No data to display  Initial Impression / Assessment and Plan / UC Course  I have reviewed the triage vital signs and the nursing notes.  Pertinent  imaging results that were available during my care of the patient were reviewed by me and considered in my medical decision making (see chart for details).  L knee effusion and pain, could be gout  I will have her D/c the Ibuprofen and try Naprosyn DS as noted She was placed on a knee sleeve.  Needs to FU with ortho as noted in instructions    Final Clinical Impressions(s) / UC Diagnoses   Final diagnoses:  Effusion of left knee     Discharge Instructions      Your xray is negative for arthritis. It is possible you may have gout, so the new medication I am placing you has better anti-inflammatory effect.  Follow up with [facility] in 1-3 days if you are not improving Wear the knee sleeve when you are working on your feet, take it off at bed time and your days off.  Stop the Ibuprofen and try the new prescription I sent.      ED Prescriptions     Medication Sig Dispense Auth. Provider   naproxen sodium (ANAPROX DS) 550 MG tablet Take 1 tablet (550 mg total) by mouth 2 (two) times daily with a meal. 14 tablet Rodriguez-Southworth, Nettie Elm, PA-C      PDMP not reviewed this encounter.   Garey Ham, Cordelia Poche 03/01/23 1530

## 2023-03-01 NOTE — ED Triage Notes (Signed)
Patient works on her feet two jobs. Onset of left knee pain and swelling last week. No known falls or injuries. Taking ibuprofen with no relief.

## 2023-03-01 NOTE — Discharge Instructions (Addendum)
Your xray is negative for arthritis. It is possible you may have gout, so the new medication I am placing you has better anti-inflammatory effect.  Follow up with [facility] in 1-3 days if you are not improving Wear the knee sleeve when you are working on your feet, take it off at bed time and your days off.  Stop the Ibuprofen and try the new prescription I sent.

## 2023-09-13 DIAGNOSIS — M109 Gout, unspecified: Secondary | ICD-10-CM | POA: Diagnosis not present

## 2023-11-27 DIAGNOSIS — I10 Essential (primary) hypertension: Secondary | ICD-10-CM | POA: Diagnosis not present

## 2024-02-28 DIAGNOSIS — Z1331 Encounter for screening for depression: Secondary | ICD-10-CM | POA: Diagnosis not present

## 2024-02-28 DIAGNOSIS — E785 Hyperlipidemia, unspecified: Secondary | ICD-10-CM | POA: Diagnosis not present

## 2024-02-28 DIAGNOSIS — Z1211 Encounter for screening for malignant neoplasm of colon: Secondary | ICD-10-CM | POA: Diagnosis not present

## 2024-02-28 DIAGNOSIS — Z800 Family history of malignant neoplasm of digestive organs: Secondary | ICD-10-CM | POA: Diagnosis not present

## 2024-02-28 DIAGNOSIS — M109 Gout, unspecified: Secondary | ICD-10-CM | POA: Diagnosis not present

## 2024-02-28 DIAGNOSIS — Z139 Encounter for screening, unspecified: Secondary | ICD-10-CM | POA: Diagnosis not present

## 2024-02-28 DIAGNOSIS — Z0000 Encounter for general adult medical examination without abnormal findings: Secondary | ICD-10-CM | POA: Diagnosis not present

## 2024-02-28 DIAGNOSIS — F411 Generalized anxiety disorder: Secondary | ICD-10-CM | POA: Diagnosis not present

## 2024-02-28 DIAGNOSIS — I10 Essential (primary) hypertension: Secondary | ICD-10-CM | POA: Diagnosis not present

## 2024-02-29 ENCOUNTER — Encounter (HOSPITAL_COMMUNITY): Payer: Self-pay

## 2024-02-29 ENCOUNTER — Ambulatory Visit (HOSPITAL_COMMUNITY)
Admission: EM | Admit: 2024-02-29 | Discharge: 2024-02-29 | Disposition: A | Discharge status: Home / Self Care | Attending: Emergency Medicine | Admitting: Emergency Medicine

## 2024-02-29 DIAGNOSIS — M7052 Other bursitis of knee, left knee: Secondary | ICD-10-CM

## 2024-02-29 DIAGNOSIS — M109 Gout, unspecified: Secondary | ICD-10-CM | POA: Diagnosis not present

## 2024-02-29 MED ORDER — PREDNISONE 20 MG PO TABS: 40.0000 mg | ORAL_TABLET | Freq: Every day | ORAL | 0 refills | Status: AC

## 2024-02-29 NOTE — ED Provider Notes (Signed)
 [facility]    CSN: [id number] Arrival date & time: 02/29/24  1346      History   Chief Complaint Chief Complaint  Patient presents with   Knee Pain    HPI Laura Vega is a 63 y.o. female.  1 week history of left knee pain and swelling Reports gout history She ate a cheeseburger a few days before symptoms started  She went to her primary care yesterday for this and was prescribed indomethacin. She has not taken any yet.  She has a knee brace from prior visit but hasn't worn it Denies injury or trauma  Past Medical History:  Diagnosis Date   Allergies    Hypertension     Patient Active Problem List   Diagnosis Date Noted   SBO (small bowel obstruction) (HCC) 09/15/2020   COVID-19 virus infection     Past Surgical History:  Procedure Laterality Date   ABDOMINAL HYSTERECTOMY      OB History   No obstetric history on file.      Home Medications    Prior to Admission medications   Medication Sig Start Date End Date Taking? Authorizing Provider  predniSONE  (DELTASONE ) 20 MG tablet Take 2 tablets (40 mg total) by mouth daily with breakfast for 5 days. 02/29/24 03/05/24 Yes Christiona Siddique, [place] PA-C    Family History Family History  Problem Relation Age of Onset   Cancer Mother    Kidney disease Mother    Cancer Sister     Social History Social History   Tobacco Use   Smoking status: Some Days   Smokeless tobacco: Never  Vaping Use   Vaping status: Never Used  Substance Use Topics   Alcohol use: Yes    Alcohol/week: 16.0 standard drinks of alcohol    Types: 16 Cans of beer per week   Drug use: No     Allergies   Patient has no known allergies.   Review of Systems Review of Systems As per HPI  Physical Exam Triage Vital Signs ED Triage Vitals  Encounter Vitals Group     BP 02/29/24 1451 138/78     Girls Systolic BP Percentile --      Girls Diastolic BP Percentile --      Boys Systolic BP Percentile --      Boys  Diastolic BP Percentile --      Pulse Rate 02/29/24 1451 87     Resp 02/29/24 1451 18     Temp 02/29/24 1451 98.2 F (36.8 C)     Temp Source 02/29/24 1451 Oral     SpO2 02/29/24 1451 98 %     Weight --      Height --      Head Circumference --      Peak Flow --      Pain Score 02/29/24 1452 0     Pain Loc --      Pain Education --      Exclude from Growth Chart --    No data found.  Updated Vital Signs BP 138/78 (BP Location: Left Arm)   Pulse 87   Temp 98.2 F (36.8 C) (Oral)   Resp 18   SpO2 98%    Physical Exam Vitals and nursing note reviewed.  Constitutional:      General: She is not in acute distress. HENT:     Mouth/Throat:     Pharynx: Oropharynx is clear.   Cardiovascular:     Rate and Rhythm: Normal  rate and regular rhythm.     Pulses: Normal pulses.     Heart sounds: Normal heart sounds.  Pulmonary:     Effort: Pulmonary effort is normal.     Breath sounds: Normal breath sounds.   Musculoskeletal:     Cervical back: Normal range of motion.     Left knee: Swelling (mild) present. Normal range of motion. No tenderness. Normal pulse.     Comments: Mild localized swelling left knee consistent with bursitis. Non tender to palpation. Full ROM without pain. No erythema or warmth . Distal sensation intact, DP pulse 2+   Skin:    General: Skin is warm and dry.     Capillary Refill: Capillary refill takes less than 2 seconds.     Findings: No erythema or rash.   Neurological:     Mental Status: She is alert and oriented to person, place, and time.     UC Treatments / Results  Labs (all labs ordered are listed, but only abnormal results are displayed) Labs Reviewed - No data to display  EKG  Radiology No results found.  Procedures Procedures (including critical care time)  Medications Ordered in UC Medications - No data to display  Initial Impression / Assessment and Plan / UC Course  I have reviewed the triage vital signs and the nursing  notes.  Pertinent labs & imaging results that were available during my care of the patient were reviewed by me and considered in my medical decision making (see chart for details).  Mild swelling of left knee. Non tender. Full ROM. Suspect bursitis / effusion  She is more concerned for gout. She is voicing frustration that her PCP saw her yesterday and didn't do anything. I have discussed with patient that indomethacin is the correct treatment for gout. However patient is stating she wants prednisone  instead. Reports had it last year that helped.  Advised to not start the indomethacin as I am sending the prednisone  burst. Have provided with [facility] to establish with for follow ups and further management as needed. She also wants a primary within [place], and the QR code was provided  Final Clinical Impressions(s) / UC Diagnoses   Final diagnoses:  Acute gout of left knee, unspecified cause  Bursitis of left knee, unspecified bursa     Discharge Instructions      Prednisone  two tablets daily for 5 days in a row. This is to treat gout flare. Continue using the knee brace for swelling if comfortable  Please call [facility] to make an appointment for follow up regarding knee pain and future treatments   You can scan the QR code on the last page to get established with a primary care provider     ED Prescriptions     Medication Sig Dispense Auth. Provider   predniSONE  (DELTASONE ) 20 MG tablet Take 2 tablets (40 mg total) by mouth daily with breakfast for 5 days. 10 tablet Shivani Barrantes, [place], PA-C      PDMP not reviewed this encounter.   Griffin Dewilde, [place], PA-C 02/29/24 [id number]

## 2024-02-29 NOTE — Discharge Instructions (Addendum)
 Prednisone  two tablets daily for 5 days in a row. This is to treat gout flare. Continue using the knee brace for swelling if comfortable  Please call [facility] to make an appointment for follow up regarding knee pain and future treatments   You can scan the QR code on the last page to get established with a primary care provider

## 2024-02-29 NOTE — ED Triage Notes (Signed)
 Pt c/o lt knee swelling x1wk. States went to her PCP and they said it was gout. States her pharmacy said she needed a steroid.

## 2024-03-03 ENCOUNTER — Other Ambulatory Visit: Payer: Self-pay | Admitting: Physician Assistant

## 2024-03-03 DIAGNOSIS — Z1231 Encounter for screening mammogram for malignant neoplasm of breast: Secondary | ICD-10-CM

## 2024-03-11 DIAGNOSIS — E785 Hyperlipidemia, unspecified: Secondary | ICD-10-CM | POA: Diagnosis not present

## 2024-03-13 ENCOUNTER — Ambulatory Visit

## 2024-03-18 ENCOUNTER — Ambulatory Visit

## 2024-03-19 ENCOUNTER — Ambulatory Visit
Admission: RE | Admit: 2024-03-19 | Discharge: 2024-03-19 | Disposition: A | Discharge status: Home / Self Care | Source: Ambulatory Visit | Attending: Physician Assistant | Admitting: Physician Assistant

## 2024-03-19 DIAGNOSIS — Z1231 Encounter for screening mammogram for malignant neoplasm of breast: Secondary | ICD-10-CM

## 2024-03-27 DIAGNOSIS — Z5181 Encounter for therapeutic drug level monitoring: Secondary | ICD-10-CM | POA: Diagnosis not present

## 2024-07-04 DIAGNOSIS — E785 Hyperlipidemia, unspecified: Secondary | ICD-10-CM | POA: Diagnosis not present
